# Patient Record
Sex: Female | Born: 1950 | Race: White | Hispanic: No | Marital: Married | Smoking: Never smoker
Health system: Southern US, Community
[De-identification: ages and names within clinical notes are randomized; demographics above are authoritative.]

## PROBLEM LIST (undated history)

## (undated) DIAGNOSIS — I493 Ventricular premature depolarization: Secondary | ICD-10-CM

## (undated) DIAGNOSIS — C181 Malignant neoplasm of appendix: Secondary | ICD-10-CM

## (undated) HISTORY — PX: TONSILLECTOMY: SUR1361

---

## 1990-10-27 HISTORY — PX: APPENDECTOMY: SHX54

## 1990-10-27 HISTORY — PX: COLON SURGERY: SHX602

## 2014-08-03 DIAGNOSIS — R079 Chest pain, unspecified: Secondary | ICD-10-CM | POA: Diagnosis present

## 2014-08-03 DIAGNOSIS — R002 Palpitations: Secondary | ICD-10-CM | POA: Insufficient documentation

## 2014-08-03 DIAGNOSIS — H5200 Hypermetropia, unspecified eye: Secondary | ICD-10-CM | POA: Insufficient documentation

## 2014-08-03 DIAGNOSIS — N390 Urinary tract infection, site not specified: Secondary | ICD-10-CM | POA: Insufficient documentation

## 2014-08-03 DIAGNOSIS — R319 Hematuria, unspecified: Secondary | ICD-10-CM | POA: Insufficient documentation

## 2014-08-03 DIAGNOSIS — H524 Presbyopia: Secondary | ICD-10-CM | POA: Insufficient documentation

## 2014-08-03 DIAGNOSIS — R102 Pelvic and perineal pain: Secondary | ICD-10-CM | POA: Insufficient documentation

## 2014-08-03 DIAGNOSIS — R1032 Left lower quadrant pain: Secondary | ICD-10-CM | POA: Insufficient documentation

## 2014-08-03 DIAGNOSIS — M8580 Other specified disorders of bone density and structure, unspecified site: Secondary | ICD-10-CM | POA: Insufficient documentation

## 2014-08-03 DIAGNOSIS — R300 Dysuria: Secondary | ICD-10-CM | POA: Insufficient documentation

## 2015-10-07 DIAGNOSIS — M810 Age-related osteoporosis without current pathological fracture: Secondary | ICD-10-CM | POA: Insufficient documentation

## 2015-10-07 DIAGNOSIS — R109 Unspecified abdominal pain: Secondary | ICD-10-CM | POA: Insufficient documentation

## 2015-10-07 DIAGNOSIS — S92502A Displaced unspecified fracture of left lesser toe(s), initial encounter for closed fracture: Secondary | ICD-10-CM | POA: Insufficient documentation

## 2015-10-07 DIAGNOSIS — Z124 Encounter for screening for malignant neoplasm of cervix: Secondary | ICD-10-CM | POA: Insufficient documentation

## 2015-10-07 DIAGNOSIS — H53139 Sudden visual loss, unspecified eye: Secondary | ICD-10-CM | POA: Insufficient documentation

## 2015-10-07 DIAGNOSIS — Z9049 Acquired absence of other specified parts of digestive tract: Secondary | ICD-10-CM | POA: Insufficient documentation

## 2015-10-12 DIAGNOSIS — M810 Age-related osteoporosis without current pathological fracture: Secondary | ICD-10-CM | POA: Insufficient documentation

## 2015-10-12 DIAGNOSIS — R21 Rash and other nonspecific skin eruption: Secondary | ICD-10-CM | POA: Insufficient documentation

## 2015-11-28 ENCOUNTER — Encounter (HOSPITAL_BASED_OUTPATIENT_CLINIC_OR_DEPARTMENT_OTHER): Payer: Self-pay

## 2015-11-28 ENCOUNTER — Inpatient Hospital Stay (HOSPITAL_BASED_OUTPATIENT_CLINIC_OR_DEPARTMENT_OTHER)
Admission: EM | Admit: 2015-11-28 | Discharge: 2015-12-01 | DRG: 287 | Disposition: A | Discharge status: Home / Self Care | Payer: 59 | Attending: Cardiology | Admitting: Cardiology

## 2015-11-28 ENCOUNTER — Emergency Department (HOSPITAL_BASED_OUTPATIENT_CLINIC_OR_DEPARTMENT_OTHER): Payer: 59

## 2015-11-28 DIAGNOSIS — Q676 Pectus excavatum: Secondary | ICD-10-CM | POA: Diagnosis not present

## 2015-11-28 DIAGNOSIS — I471 Supraventricular tachycardia: Secondary | ICD-10-CM | POA: Diagnosis present

## 2015-11-28 DIAGNOSIS — R072 Precordial pain: Secondary | ICD-10-CM | POA: Diagnosis not present

## 2015-11-28 DIAGNOSIS — I493 Ventricular premature depolarization: Secondary | ICD-10-CM | POA: Diagnosis present

## 2015-11-28 DIAGNOSIS — Z85038 Personal history of other malignant neoplasm of large intestine: Secondary | ICD-10-CM | POA: Diagnosis not present

## 2015-11-28 DIAGNOSIS — I351 Nonrheumatic aortic (valve) insufficiency: Secondary | ICD-10-CM | POA: Diagnosis present

## 2015-11-28 DIAGNOSIS — I200 Unstable angina: Secondary | ICD-10-CM | POA: Diagnosis present

## 2015-11-28 DIAGNOSIS — I472 Ventricular tachycardia: Secondary | ICD-10-CM | POA: Diagnosis present

## 2015-11-28 DIAGNOSIS — I2510 Atherosclerotic heart disease of native coronary artery without angina pectoris: Secondary | ICD-10-CM | POA: Diagnosis not present

## 2015-11-28 DIAGNOSIS — Z8249 Family history of ischemic heart disease and other diseases of the circulatory system: Secondary | ICD-10-CM

## 2015-11-28 DIAGNOSIS — E876 Hypokalemia: Secondary | ICD-10-CM | POA: Diagnosis present

## 2015-11-28 DIAGNOSIS — I340 Nonrheumatic mitral (valve) insufficiency: Secondary | ICD-10-CM | POA: Diagnosis present

## 2015-11-28 DIAGNOSIS — R0789 Other chest pain: Secondary | ICD-10-CM | POA: Diagnosis not present

## 2015-11-28 DIAGNOSIS — R079 Chest pain, unspecified: Secondary | ICD-10-CM | POA: Diagnosis present

## 2015-11-28 DIAGNOSIS — I4729 Other ventricular tachycardia: Secondary | ICD-10-CM

## 2015-11-28 HISTORY — DX: Malignant neoplasm of appendix: C18.1

## 2015-11-28 LAB — COMPREHENSIVE METABOLIC PANEL
ALK PHOS: 69 U/L (ref 38–126)
ALT: 22 U/L (ref 14–54)
AST: 28 U/L (ref 15–41)
Albumin: 4.7 g/dL (ref 3.5–5.0)
Anion gap: 8 (ref 5–15)
BILIRUBIN TOTAL: 1.8 mg/dL — AB (ref 0.3–1.2)
BUN: 13 mg/dL (ref 6–20)
CALCIUM: 9.4 mg/dL (ref 8.9–10.3)
CO2: 25 mmol/L (ref 22–32)
CREATININE: 0.63 mg/dL (ref 0.44–1.00)
Chloride: 105 mmol/L (ref 101–111)
GFR calc Af Amer: >60 mL/min (ref >60)
Glucose, Bld: 87 mg/dL (ref 65–99)
Potassium: 3.4 mmol/L — ABNORMAL LOW (ref 3.5–5.1)
Sodium: 138 mmol/L (ref 135–145)
TOTAL PROTEIN: 7.9 g/dL (ref 6.5–8.1)

## 2015-11-28 LAB — CBC
HCT: 40.8 % (ref 36.0–46.0)
HEMATOCRIT: 34.4 % — AB (ref 36.0–46.0)
HEMOGLOBIN: 13.3 g/dL (ref 12.0–15.0)
HEMOGLOBIN: 11.9 g/dL — AB (ref 12.0–15.0)
MCH: 29.7 pg (ref 26.0–34.0)
MCH: 31.2 pg (ref 26.0–34.0)
MCHC: 32.6 g/dL (ref 30.0–36.0)
MCHC: 34.6 g/dL (ref 30.0–36.0)
MCV: 91.1 fL (ref 78.0–100.0)
MCV: 90.1 fL (ref 78.0–100.0)
PLATELETS: 191 10*3/uL (ref 150–400)
Platelets: 160 10*3/uL (ref 150–400)
RBC: 4.48 MIL/uL (ref 3.87–5.11)
RBC: 3.82 MIL/uL — AB (ref 3.87–5.11)
RDW: 12.7 % (ref 11.5–15.5)
RDW: 13 % (ref 11.5–15.5)
WBC: 6.6 10*3/uL (ref 4.0–10.5)
WBC: 5.3 10*3/uL (ref 4.0–10.5)

## 2015-11-28 LAB — CREATININE, SERUM: Creatinine, Ser: 0.69 mg/dL (ref 0.44–1.00)

## 2015-11-28 LAB — MAGNESIUM: Magnesium: 2 mg/dL (ref 1.7–2.4)

## 2015-11-28 LAB — TROPONIN I: Troponin I: <0.03 ng/mL (ref <0.031)

## 2015-11-28 LAB — BRAIN NATRIURETIC PEPTIDE: B NATRIURETIC PEPTIDE 5: 63.9 pg/mL (ref 0.0–100.0)

## 2015-11-28 LAB — TSH: TSH: 4.759 u[IU]/mL — AB (ref 0.350–4.500)

## 2015-11-28 MED ORDER — ASPIRIN 81 MG PO CHEW
324.0000 mg | CHEWABLE_TABLET | Freq: Once | ORAL | Status: AC
  Administered 2015-11-28: 324 mg via ORAL
  Filled 2015-11-28: qty 4

## 2015-11-28 MED ORDER — ACETAMINOPHEN 325 MG PO TABS: 650.0000 mg | ORAL_TABLET | ORAL | Status: DC | PRN

## 2015-11-28 MED ORDER — METOPROLOL TARTRATE 12.5 MG HALF TABLET
12.5000 mg | ORAL_TABLET | Freq: Two times a day (BID) | ORAL | Status: DC
  Administered 2015-11-28 – 2015-12-01 (×4): 12.5 mg via ORAL
  Filled 2015-11-28 (×4): qty 1

## 2015-11-28 MED ORDER — HEPARIN SODIUM (PORCINE) 5000 UNIT/ML IJ SOLN
5000.0000 [IU] | Freq: Three times a day (TID) | INTRAMUSCULAR | Status: DC
  Administered 2015-11-28 – 2015-11-30 (×5): 5000 [IU] via SUBCUTANEOUS
  Filled 2015-11-28 (×4): qty 1

## 2015-11-28 MED ORDER — ASPIRIN 81 MG PO CHEW: 324.0000 mg | CHEWABLE_TABLET | ORAL | Status: AC

## 2015-11-28 MED ORDER — POTASSIUM CHLORIDE CRYS ER 20 MEQ PO TBCR
40.0000 meq | EXTENDED_RELEASE_TABLET | Freq: Once | ORAL | Status: AC
  Administered 2015-11-28: 40 meq via ORAL
  Filled 2015-11-28: qty 2

## 2015-11-28 MED ORDER — NITROGLYCERIN 0.4 MG SL SUBL: 0.4000 mg | SUBLINGUAL_TABLET | SUBLINGUAL | Status: DC | PRN

## 2015-11-28 MED ORDER — ONDANSETRON HCL 4 MG/2ML IJ SOLN: 4.0000 mg | Freq: Four times a day (QID) | INTRAMUSCULAR | Status: DC | PRN

## 2015-11-28 MED ORDER — ASPIRIN 300 MG RE SUPP: 300.0000 mg | RECTAL | Status: AC

## 2015-11-28 MED ORDER — SODIUM CHLORIDE 0.9 % IV SOLN: 250.0000 mL | INTRAVENOUS | Status: DC | PRN

## 2015-11-28 MED ORDER — NITROGLYCERIN 0.4 MG SL SUBL
0.4000 mg | SUBLINGUAL_TABLET | SUBLINGUAL | Status: DC | PRN
  Administered 2015-11-28: 0.4 mg via SUBLINGUAL

## 2015-11-28 MED ORDER — SODIUM CHLORIDE 0.9% FLUSH
3.0000 mL | Freq: Two times a day (BID) | INTRAVENOUS | Status: DC
  Administered 2015-11-28 – 2015-12-01 (×5): 3 mL via INTRAVENOUS

## 2015-11-28 MED ORDER — NITROGLYCERIN 0.4 MG SL SUBL
SUBLINGUAL_TABLET | SUBLINGUAL | Status: AC
  Filled 2015-11-28: qty 3

## 2015-11-28 MED ORDER — SODIUM CHLORIDE 0.9% FLUSH: 3.0000 mL | INTRAVENOUS | Status: DC | PRN

## 2015-11-28 MED ORDER — ATORVASTATIN CALCIUM 80 MG PO TABS
80.0000 mg | ORAL_TABLET | Freq: Every day | ORAL | Status: DC
  Filled 2015-11-28 (×2): qty 1

## 2015-11-28 MED ORDER — ASPIRIN EC 81 MG PO TBEC
81.0000 mg | DELAYED_RELEASE_TABLET | Freq: Every day | ORAL | Status: DC
  Administered 2015-11-29 – 2015-12-01 (×3): 81 mg via ORAL
  Filled 2015-11-28 (×3): qty 1

## 2015-11-28 MED ORDER — ACTIVE PARTNERSHIP FOR HEALTH OF YOUR HEART BOOK
Freq: Once | Status: AC
  Administered 2015-11-28: 22:00:00
  Filled 2015-11-28: qty 1

## 2015-11-28 NOTE — H&P (Signed)
Physician History and Physical    Donna Dickson MRN: [id number] DOB/AGE: November 01, 1950 65 y.o. Admit date: 11/28/2015  Primary Care Physician: Dr [place] Cellar in [facility]  Primary Cardiologist: New  HPI: 65 yo with no significant past history presents for evaluation of chest pain and NSVT.  Patient has been generally healthy and takes no medications.  About 2 weeks ago, she began to notice episodes of dyspnea that would last for a few minutes.  They were not clearly related to exertion and sometimes occurred at rest.  These would happen daily.  She did not note palpitations.  Starting about 2 days ago, she developed mild substernal tightness.  This has been constant now for about 2 days.  It is still present.  Today, she developed tingling in her left forearm so went to the ER.  No notes are available yet from the ER, but apparently she had a run of 11 beats of NSVT.  She has had frequent PVCs.  K is low at 3.4.  Troponin initially was normal.  ECG showed nonspecific ST depression inferiorly and anterolaterally, and ?prior lateral MI.   Currently, has very "minor" chest tightness.    She has never smoked.  Has coronary disease in her family.   Review of systems complete and found to be negative unless listed above   PMH: - Pectus excavatum  FH:  - Grandfather with MI in his 33s.  - Brother #1 with MI in his 31s. - Brother #2 with MI in his 78s  Current Facility-Administered Medications  Medication Dose Route Frequency Provider Last Rate Last Dose  . 0.9 %  sodium chloride infusion  250 mL Intravenous PRN Larey Dresser, MD      . acetaminophen (TYLENOL) tablet 650 mg  650 mg Oral Q4H PRN Larey Dresser, MD      . active partnership for health of your heart book   Does not apply Once Burnell Blanks, MD      . aspirin chewable tablet 324 mg  324 mg Oral NOW Larey Dresser, MD       Or  . aspirin suppository 300 mg  300 mg Rectal NOW Larey Dresser, MD      . Derrill Memo ON  11/29/2015] aspirin EC tablet 81 mg  81 mg Oral Daily Larey Dresser, MD      . Derrill Memo ON 11/29/2015] atorvastatin (LIPITOR) tablet 80 mg  80 mg Oral q1800 Larey Dresser, MD      . heparin injection 5,000 Units  5,000 Units Subcutaneous 3 times per day Larey Dresser, MD      . metoprolol tartrate (LOPRESSOR) tablet 12.5 mg  12.5 mg Oral BID Larey Dresser, MD      . nitroGLYCERIN (NITROSTAT) SL tablet 0.4 mg  0.4 mg Sublingual Q5 min PRN Tanna Furry, MD   0.4 mg at 11/28/15 1748  . nitroGLYCERIN (NITROSTAT) SL tablet 0.4 mg  0.4 mg Sublingual Q5 Min x 3 PRN Larey Dresser, MD      . ondansetron [facility]) injection 4 mg  4 mg Intravenous Q6H PRN Larey Dresser, MD      . potassium chloride SA (K-DUR,KLOR-CON) CR tablet 40 mEq  40 mEq Oral Once Larey Dresser, MD      . sodium chloride flush (NS) 0.9 % injection 3 mL  3 mL Intravenous Q12H Larey Dresser, MD      . sodium chloride flush (NS) 0.9 %  injection 3 mL  3 mL Intravenous PRN Larey Dresser, MD         Social History   Social History  . Marital Status: Married    Spouse Name: N/A  . Number of Children: N/A  . Years of Education: N/A   Occupational History  . Not on file.   Social History Main Topics  . Smoking status: Never Smoker   . Smokeless tobacco: Not on file  . Alcohol Use: Yes     Comment: rare  . Drug Use: No  . Sexual Activity: Not on file   Other Topics Concern  . Not on file   Social History Narrative  . No narrative on file    Physical Exam: Blood pressure 96/61, pulse 68, temperature 97.9 F (36.6 C), temperature source Oral, resp. rate 18, height 5\' 3"  (1.6 m), weight 116 lb 8 oz (52.844 kg), SpO2 100 %.  General: NAD Neck: No JVD, no thyromegaly or thyroid nodule.  Lungs: Clear to auscultation bilaterally with normal respiratory effort. CV: Nondisplaced PMI.  Heart regular S1/S2, no S3/S4, no murmur.  No peripheral edema.  No carotid bruit.  Normal pedal pulses.  Abdomen: Soft, nontender, no  hepatosplenomegaly, no distention.  Skin: Intact without lesions or rashes.  Neurologic: Alert and oriented x 3.  Psych: Normal affect. Extremities: No clubbing or cyanosis.  HEENT: Normal.   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  ASSESSMENT AND PLAN:  65 yo with no significant past history presents for evaluation of chest pain and NSVT. 1. Chest pain: Atypical, prolonged and mild chest pain with negative cardiac enzymes so far.  Prior to chest pain, she had episodes of dyspnea not clearly related to exertion.  Atypical symptoms for coronary ischemia.  However, her ECG is abnormal, and per report, she had an episode of [facility]VT at [facility].  - Cycle troponin, heparin if troponin positive.  - ASA 81 - Add statin and metoprolol 12.5 mg bid for now.  - If she rules out, would plan for ETT-Cardiolite tomorrow.  2. Ventricular arrhythmias: PVCs ongoing currently. Reported 11 beat run of NSVT.  Must be concerned for ventricular arrhythmias related to ischemia.  Also has mildly low K.  - Replace K, check Mg - metoprolol 12.5 mg bid for now. - Echo  Signed: Loralie Champagne 11/28/2015, 9:46 PM

## 2015-11-28 NOTE — Plan of Care (Signed)
Problem: Consults Goal: Chest Pain Patient Education (See Patient Education module for education specifics.) Outcome: Progressing Showed the client the Angina Pectoris video and answered her questions waiting for cardiology to come admit her

## 2015-11-28 NOTE — ED Notes (Addendum)
SOB, chest tightness x 1 weeks-tingling to left arm x today-NAD-steady gait

## 2015-11-28 NOTE — ED Notes (Signed)
Patient transported to X-ray 

## 2015-11-28 NOTE — ED Provider Notes (Signed)
CSN: [id number]     Arrival date & time 11/28/15  1631 History   First MD Initiated Contact with Patient 11/28/15 1656     Chief Complaint  Patient presents with  . Shortness of Breath     HPI  Patient presents for evaluation of difficulty breathing and chest pain. She describes anywhere from 1-2 weeks of exertional dyspnea. She states she had a visitor this week and was a little bit her around the house. States several times she had to sit because she was short of breath. Denies any pain or tightness. During the day today and perhaps somewhat yesterday describes a tightness or "soreness" in her anterior chest. Today had a similar feeling in her forearm and presents here. No personal history of heart disease. No history of hypertension diabetes or hypercholesterolemia. Lifetime nonsmoker. Her brother had an MI. Had grandparents with heart disease.  Past Medical History  Diagnosis Date  . 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 Colon surgery     No family history on file. Social History  Substance Use Topics  . Smoking status: Never Smoker   . Smokeless tobacco: None  . Alcohol Use: Yes     Comment: rare   OB History    No data available     Review of Systems  Constitutional: Negative for fever, chills, diaphoresis, appetite change and fatigue.  HENT: Negative for mouth sores, sore throat and trouble swallowing.   Eyes: Negative for visual disturbance.  Respiratory: Negative for cough, chest tightness, shortness of breath and wheezing.   Cardiovascular: Negative for chest pain.  Gastrointestinal: Negative for nausea, vomiting, abdominal pain, diarrhea and abdominal distention.  Endocrine: Negative for polydipsia, polyphagia and polyuria.  Genitourinary: Negative for dysuria, frequency and hematuria.  Musculoskeletal: Negative for gait problem.  Skin: Negative for color change, pallor and rash.  Neurological: Negative for dizziness,  syncope, light-headedness and headaches.  Hematological: Does not bruise/bleed easily.  Psychiatric/Behavioral: Negative for behavioral problems and confusion.      Allergies  Review of patient's allergies indicates no known allergies.  Home Medications   Prior to Admission medications   Not on File   BP 96/61 mmHg  Pulse 68  Temp(Src) 97.9 F (36.6 C) (Oral)  Resp 18  Ht 5\' 3"  (1.6 m)  Wt 116 lb 8 oz (52.844 kg)  BMI 20.64 kg/m2  SpO2 100% Physical Exam  Constitutional: She is oriented to person, place, and time. She appears well-developed and well-nourished. No distress.  HENT:  Head: Normocephalic.  Eyes: Conjunctivae are normal. Pupils are equal, round, and reactive to light. No scleral icterus.  Neck: Normal range of motion. Neck supple. No thyromegaly present.  Cardiovascular: Normal rate.  An irregular rhythm present. Exam reveals no gallop and no friction rub.   No murmur heard. Bigeminy, and trigeminy on the monitor, and resting EKG.  Pulmonary/Chest: Effort normal and breath sounds normal. No respiratory distress. She has no wheezes. She has no rales.  Abdominal: Soft. Bowel sounds are normal. She exhibits no distension. There is no tenderness. There is no rebound.  Musculoskeletal: Normal range of motion.  Neurological: She is alert and oriented to person, place, and time.  Skin: Skin is warm and dry. No rash noted.  Psychiatric: She has a normal mood and affect. Her behavior is normal.    ED Course  Procedures (including critical care time) Labs Review Labs Reviewed  COMPREHENSIVE METABOLIC PANEL - Abnormal; Notable  for the following:    Potassium 3.4 (*)    Total Bilirubin 1.8 (*)    All other components within normal limits  CBC - Abnormal; Notable for the following:    RBC 3.82 (*)    Hemoglobin 11.9 (*)    HCT 34.4 (*)    All other components within normal limits  TSH - Abnormal; Notable for the following:    TSH 4.759 (*)    All other  components within normal limits  TROPONIN I  CBC  CREATININE, SERUM  TROPONIN I  BRAIN NATRIURETIC PEPTIDE  MAGNESIUM  BASIC METABOLIC PANEL  CBC  TROPONIN I  TROPONIN I  LIPID PANEL    Imaging Review Dg Chest 2 View  11/28/2015  CLINICAL DATA:  Chest pain EXAM: CHEST  2 VIEW COMPARISON:  None. FINDINGS: Normal heart size. Normal mediastinal contour. No pneumothorax. No pleural effusion. Lungs appear clear, with no acute consolidative airspace disease and no pulmonary edema. Pectus excavatum deformity. IMPRESSION: No active cardiopulmonary disease. Pectus excavatum deformity. Electronically Signed   By: Ilona Sorrel M.D.   On: 11/28/2015 17:16   I have personally reviewed and evaluated these images and lab results as part of my medical decision-making.   EKG Interpretation   Date/Time:  Wednesday November 28 2015 18:10:03 EST Ventricular Rate:  81 PR Interval:  143 QRS Duration: 75 QT Interval:  400 QTC Calculation: 464 R Axis:   104 Text Interpretation:  Sinus rhythm Paired ventricular premature complexes  Right atrial enlargement Lateral infarct, old Confirmed by Jeneen Rinks  MD, [place]  [phone]) on 11/28/2015 11:45:25 PM Also confirmed by Jeneen Rinks  MD, [place] ([id number])   on 11/28/2015 11:46:04 PM      MDM   Final diagnoses:  Chest pain, unspecified chest pain type    Patient reports intermittent episodes of dyspnea over the last several weeks. More equally this week. Describes a "tightness or soreness" in her chest that she states is very very mild. Today she had some discomfort in her lower forearm described as a tightness as well. She is uncertain if this was associated with the discomfort in her chest.  In general she has increasing dyspnea, particularly with exertion. No chest tightness. All in the emergency room she had a recorded strip 2 of an SVT. One of 90 beats, one of 10 beats. Similar morphology to her PVCs on her resting EKG. First enzyme is normal. Given a single  nitroglycerin and states that her minimal chest discomfort is gone. I discussed the case with Dr. Levi Aland. Except the patient transferred to [facility] for rule out, telemetry, and testing regarding myocardial ischemia.    Tanna Furry, MD 11/28/15 [phone]

## 2015-11-29 ENCOUNTER — Other Ambulatory Visit (HOSPITAL_COMMUNITY): Payer: 59

## 2015-11-29 ENCOUNTER — Encounter (HOSPITAL_COMMUNITY): Payer: Self-pay | Admitting: General Practice

## 2015-11-29 ENCOUNTER — Inpatient Hospital Stay (HOSPITAL_COMMUNITY): Payer: 59

## 2015-11-29 DIAGNOSIS — I4729 Other ventricular tachycardia: Secondary | ICD-10-CM

## 2015-11-29 DIAGNOSIS — R079 Chest pain, unspecified: Secondary | ICD-10-CM

## 2015-11-29 DIAGNOSIS — R072 Precordial pain: Secondary | ICD-10-CM

## 2015-11-29 DIAGNOSIS — R0789 Other chest pain: Secondary | ICD-10-CM

## 2015-11-29 DIAGNOSIS — I472 Ventricular tachycardia: Secondary | ICD-10-CM

## 2015-11-29 DIAGNOSIS — I493 Ventricular premature depolarization: Secondary | ICD-10-CM

## 2015-11-29 LAB — NM MYOCAR MULTI W/SPECT W/WALL MOTION / EF
CHL CUP MPHR: 158 {beats}/min
CHL CUP NUCLEAR SDS: 4
CHL CUP NUCLEAR SRS: 5
CHL CUP NUCLEAR SSS: 9
CHL CUP STRESS STAGE 1 DBP: 55 mmHg
CHL CUP STRESS STAGE 1 GRADE: 0 %
CHL CUP STRESS STAGE 1 HR: 67 {beats}/min
CHL CUP STRESS STAGE 2 SPEED: 0 mph
CHL CUP STRESS STAGE 3 GRADE: 0 %
CHL CUP STRESS STAGE 3 SPEED: 1.3 mph
CHL CUP STRESS STAGE 4 GRADE: 0.1 %
CHL CUP STRESS STAGE 4 SPEED: 1.4 mph
CHL CUP STRESS STAGE 5 DBP: 76 mmHg
CHL CUP STRESS STAGE 6 SPEED: 2.5 mph
CHL CUP STRESS STAGE 7 GRADE: 14 %
CHL CUP STRESS STAGE 7 SPEED: 3.4 mph
CHL CUP STRESS STAGE 8 SBP: 128 mmHg
CHL CUP STRESS STAGE 8 SPEED: 0 mph
CHL CUP STRESS STAGE 9 HR: 83 {beats}/min
CSEPPBP: 109 mmHg
CSEPPHR: 133 {beats}/min
CSEPPMHR: 84 %
Estimated workload: 10.1 METS
Exercise duration (min): 8 min
Exercise duration (sec): 31 s
LV dias vol: 54 mL
LV sys vol: 14 mL
NUC STRESS TID: 1.21
Percent HR: 89 %
RATE: 0.31
RPE: 17
Rest HR: 65 {beats}/min
Stage 1 SBP: 94 mmHg
Stage 1 Speed: 0 mph
Stage 2 Grade: 0 %
Stage 2 HR: 80 {beats}/min
Stage 3 HR: 82 {beats}/min
Stage 4 HR: 80 {beats}/min
Stage 5 Grade: 10 %
Stage 5 HR: 115 {beats}/min
Stage 5 SBP: 116 mmHg
Stage 5 Speed: 1.7 mph
Stage 6 DBP: 62 mmHg
Stage 6 Grade: 12 %
Stage 6 HR: 129 {beats}/min
Stage 6 SBP: 81 mmHg
Stage 7 DBP: 66 mmHg
Stage 7 HR: 133 {beats}/min
Stage 7 SBP: 109 mmHg
Stage 8 DBP: 53 mmHg
Stage 8 Grade: 0 %
Stage 8 HR: 120 {beats}/min
Stage 9 Grade: 0 %
Stage 9 Speed: 0 mph

## 2015-11-29 LAB — LIPID PANEL
Cholesterol: 136 mg/dL (ref 0–200)
HDL: 73 mg/dL (ref >40)
LDL CALC: 53 mg/dL (ref 0–99)
TRIGLYCERIDES: 49 mg/dL (ref <150)
Total CHOL/HDL Ratio: 1.9 RATIO
VLDL: 10 mg/dL (ref 0–40)

## 2015-11-29 LAB — BASIC METABOLIC PANEL
Anion gap: 5 (ref 5–15)
BUN: 10 mg/dL (ref 6–20)
CALCIUM: 9 mg/dL (ref 8.9–10.3)
CO2: 28 mmol/L (ref 22–32)
CREATININE: 0.74 mg/dL (ref 0.44–1.00)
Chloride: 112 mmol/L — ABNORMAL HIGH (ref 101–111)
GFR calc Af Amer: >60 mL/min (ref >60)
GLUCOSE: 96 mg/dL (ref 65–99)
POTASSIUM: 4.1 mmol/L (ref 3.5–5.1)
SODIUM: 145 mmol/L (ref 135–145)

## 2015-11-29 LAB — CBC
HEMATOCRIT: 35.7 % — AB (ref 36.0–46.0)
Hemoglobin: 11.9 g/dL — ABNORMAL LOW (ref 12.0–15.0)
MCH: 30.4 pg (ref 26.0–34.0)
MCHC: 33.3 g/dL (ref 30.0–36.0)
MCV: 91.1 fL (ref 78.0–100.0)
PLATELETS: 168 10*3/uL (ref 150–400)
RBC: 3.92 MIL/uL (ref 3.87–5.11)
RDW: 13.1 % (ref 11.5–15.5)
WBC: 4.1 10*3/uL (ref 4.0–10.5)

## 2015-11-29 LAB — TROPONIN I: Troponin I: <0.03 ng/mL (ref <0.031)

## 2015-11-29 MED ORDER — TECHNETIUM TC 99M SESTAMIBI GENERIC - CARDIOLITE
10.0000 | Freq: Once | INTRAVENOUS | Status: AC | PRN
  Administered 2015-11-29: 10 via INTRAVENOUS

## 2015-11-29 MED ORDER — TECHNETIUM TC 99M SESTAMIBI GENERIC - CARDIOLITE
30.0000 | Freq: Once | INTRAVENOUS | Status: AC | PRN
  Administered 2015-11-29: 30 via INTRAVENOUS

## 2015-11-29 MED ORDER — SODIUM CHLORIDE 0.9 % IV SOLN
INTRAVENOUS | Status: DC
  Administered 2015-11-29: 18:00:00 via INTRAVENOUS

## 2015-11-29 NOTE — Progress Notes (Signed)
Myoview Result as below:   Blood pressure demonstrated a hypotensive response to exercise.  Downsloping ST segment depression ST segment depression of 1 mm was noted during stress in the aVF, III, II, V5 and V6 leads.  Defect 1: There is a small defect of mild severity present in the basal inferoseptal location.  This is a low risk study.  Low risk stress nuclear study with a mild basal septal perfusion defect, that is mostly fixed. This may represent an "LBBB" type artifact related to incessant monomorphic PVCs/VT with LBBB morphology.  Otherwise, there is normal perfusion and normal left ventricular regional and global systolic function.   Dr. Sallyanne Kuster recommended EP evaluation. Unable to titrate BB due to hypotension. Will give IV fluid overnight and rounding team will reasess in morning. Updated the patient with plan.   Hetty Linhart, [place]

## 2015-11-29 NOTE — Progress Notes (Signed)
Received order however pt for Myoview today. Will await results. Yves Dill CES, ACSM 9:12 AM 11/29/2015

## 2015-11-29 NOTE — Progress Notes (Signed)
Patient presented for Exercise Myoview.  Tolerated procedure well.   The patient has one episode of PVC prior to test. During stage 1 she has lots of PVCs and multiple small runs of NSVT. Stage 2 and 3 -->no episode of PVC or NSVT. During recovery she has frequent PVCS, NSVT (one run was more than 20 beats) and ventricular trigeminy.   More prominent ST depression/flateeing during stage III. Intermittent felt palpitations during test. No chest pain. Stable SOB. Final result to follow. ?EP consult vs monitor set up at discharge.    Donna Dickson, [place]

## 2015-11-29 NOTE — Progress Notes (Signed)
Subjective:  No chest pain, frequent ectopy and NS tachycardia but no awareness of it.   Objective:  Vital Signs in the last 24 hours: Temp:  [97.9 F (36.6 C)-98.4 F (36.9 C)] 98.2 F (36.8 C) (02/02 0749) Pulse Rate:  [40-82] 56 (02/02 0749) Resp:  [15-24] 20 (02/02 0749) BP: (82-126)/(45-89) 88/48 mmHg (02/02 0749) SpO2:  [99 %-100 %] 100 % (02/02 0749) Weight:  [115 lb (52.164 kg)-116 lb 8 oz (52.844 kg)] 115 lb 11.2 oz (52.481 kg) (02/02 [id number])  Intake/Output from previous day:  Intake/Output Summary (Last 24 hours) at 11/29/15 [id number] Last data filed at 11/28/15 2247  Gross per 24 hour  Intake      3 ml  Output      0 ml  Net      3 ml    Physical Exam: General appearance: alert, cooperative and no distress Neck: no carotid bruit and no JVD Lungs: clear to auscultation bilaterally Heart: regular rate and rhythm Extremities: no edema Neurologic: Grossly normal   Rate: 70  Rhythm: normal sinus rhythm and PVCs, PSVT runs, NSWCT runs  Lab Results:  Recent Labs  11/28/15 2138 11/29/15 0249  WBC 5.3 4.1  HGB 11.9* 11.9*  PLT 160 168    Recent Labs  11/28/15 1710 11/28/15 2138 11/29/15 0249  NA 138  --  145  K 3.4*  --  4.1  CL 105  --  112*  CO2 25  --  28  GLUCOSE 87  --  96  BUN 13  --  10  CREATININE 0.63 0.69 0.74    Recent Labs  11/28/15 2138 11/29/15 0249  TROPONINI <0.03 <0.03   No results for input(s): INR in the last 72 hours.  Scheduled Meds: . aspirin  324 mg Oral NOW   Or  . aspirin  300 mg Rectal NOW  . aspirin EC  81 mg Oral Daily  . atorvastatin  80 mg Oral q1800  . heparin  5,000 Units Subcutaneous 3 times per day  . metoprolol tartrate  12.5 mg Oral BID  . sodium chloride flush  3 mL Intravenous Q12H   Continuous Infusions:  PRN Meds:.sodium chloride, acetaminophen, nitroGLYCERIN, nitroGLYCERIN, ondansetron (ZOFRAN) IV, sodium chloride flush   Imaging: Dg Chest 2 View  11/28/2015  CLINICAL DATA:  Chest pain EXAM:  CHEST  2 VIEW COMPARISON:  None. FINDINGS: Normal heart size. Normal mediastinal contour. No pneumothorax. No pleural effusion. Lungs appear clear, with no acute consolidative airspace disease and no pulmonary edema. Pectus excavatum deformity. IMPRESSION: No active cardiopulmonary disease. Pectus excavatum deformity. Electronically Signed   By: Ilona Sorrel M.D.   On: 11/28/2015 17:16    Assessment/Plan:  65 yo with no significant past history presents for evaluation of chest pain and NSVT. Patient has been generally healthy and takes no medications. About 2 weeks ago, she began to notice episodes of dyspnea that would last for a few minutes. They were not clearly related to exertion and sometimes occurred at rest. These would happen daily. She did not note palpitations. Starting about 2 days ago, she developed mild substernal tightness.Troponin have been negative. Telemetry shows PVCs, PSVT, and NSWCT.  Principal Problem:   Chest pain Active Problems:   NSVT (nonsustained ventricular tachycardia) (HCC)   PLAN: ? If NSWCT is PSVT with aberrancy. Echo and exercise Myoview today (beta blocker held for stress).  Kerin Ransom PA-C 11/29/2015, 8:12 AM [phone]  I have personally seen and examined this patient with  Kerin Ransom, [place]. I agree with the assessment and plan as outlined above. She is admitted with atypical chest pain, PVCs. Troponin negative. Nuclear stress this am is pending. She had PvCs and NSVT before and after exercise but not during exercise. Echo pending today. Will titrate beta blocker for PVCs, NSVT. Further plans to follow after stress test.   Tiffiney Sparrow 11/29/2015 11:50 AM

## 2015-11-29 NOTE — Progress Notes (Signed)
Patients blood pressure remains low. Spoke with on call PA, patient is asymptomatic will continue to monitor.

## 2015-11-29 NOTE — Progress Notes (Addendum)
The client had 3 beats of VT right before 2 am and continues to have PVCs. Also had a soft BP of 82/45 during the night (see flow sheet) with no dizziness or lightheaded or any other symptoms. She received a new medication lopressor last night. Her BP did run low before this new medication and was in the 80's once yesterday during the day. I will continue to monitor the client closely.    Addendum: BP this morning 86/54 HR 70's with PVCs. No other symptoms with the soft BP. Will continue to monitor closely and give the on coming nurse this information.

## 2015-11-29 NOTE — Progress Notes (Signed)
Notified by CCMD of a 3 beat, 4 beat, and 13 beat run of Vtach all occuring since 0730. Patient asymptomatic, resting in bed. Paged on call MD to make aware.

## 2015-11-29 NOTE — Progress Notes (Signed)
UR Completed [place] Hijazi Graves-Bigelow, RN,BSN [phone]  

## 2015-11-30 ENCOUNTER — Encounter (HOSPITAL_COMMUNITY): Payer: Self-pay | Admitting: Cardiology

## 2015-11-30 ENCOUNTER — Inpatient Hospital Stay (HOSPITAL_COMMUNITY): Payer: 59

## 2015-11-30 ENCOUNTER — Encounter (HOSPITAL_COMMUNITY)
Admission: EM | Disposition: A | Discharge status: Home / Self Care | Payer: Self-pay | Source: Home / Self Care | Attending: Cardiology

## 2015-11-30 DIAGNOSIS — I2510 Atherosclerotic heart disease of native coronary artery without angina pectoris: Secondary | ICD-10-CM

## 2015-11-30 DIAGNOSIS — R931 Abnormal findings on diagnostic imaging of heart and coronary circulation: Secondary | ICD-10-CM

## 2015-11-30 HISTORY — PX: CARDIAC CATHETERIZATION: SHX172

## 2015-11-30 LAB — CBC
HEMATOCRIT: 36.9 % (ref 36.0–46.0)
HEMOGLOBIN: 12.7 g/dL (ref 12.0–15.0)
MCH: 31.2 pg (ref 26.0–34.0)
MCHC: 34.4 g/dL (ref 30.0–36.0)
MCV: 90.7 fL (ref 78.0–100.0)
Platelets: 140 10*3/uL — ABNORMAL LOW (ref 150–400)
RBC: 4.07 MIL/uL (ref 3.87–5.11)
RDW: 12.8 % (ref 11.5–15.5)
WBC: 5.3 10*3/uL (ref 4.0–10.5)

## 2015-11-30 LAB — CREATININE, SERUM: CREATININE: 0.7 mg/dL (ref 0.44–1.00)

## 2015-11-30 LAB — PROTIME-INR
INR: 1.1 (ref 0.00–1.49)
PROTHROMBIN TIME: 14.4 s (ref 11.6–15.2)

## 2015-11-30 SURGERY — LEFT HEART CATH AND CORONARY ANGIOGRAPHY
Anesthesia: LOCAL

## 2015-11-30 MED ORDER — MIDAZOLAM HCL 2 MG/2ML IJ SOLN
INTRAMUSCULAR | Status: DC | PRN
  Administered 2015-11-30: 1 mg via INTRAVENOUS

## 2015-11-30 MED ORDER — IOHEXOL 350 MG/ML SOLN
INTRAVENOUS | Status: DC | PRN
  Administered 2015-11-30: 70 mL via INTRA_ARTERIAL

## 2015-11-30 MED ORDER — SODIUM CHLORIDE 0.9% FLUSH: 3.0000 mL | INTRAVENOUS | Status: DC | PRN

## 2015-11-30 MED ORDER — METOPROLOL TARTRATE 12.5 MG HALF TABLET: 12.5000 mg | ORAL_TABLET | Freq: Two times a day (BID) | ORAL | Status: DC

## 2015-11-30 MED ORDER — HEPARIN (PORCINE) IN NACL 2-0.9 UNIT/ML-% IJ SOLN
INTRAMUSCULAR | Status: DC | PRN
  Administered 2015-11-30: 11:00:00

## 2015-11-30 MED ORDER — SODIUM CHLORIDE 0.9 % IV SOLN: 250.0000 mL | INTRAVENOUS | Status: DC | PRN

## 2015-11-30 MED ORDER — MIDAZOLAM HCL 2 MG/2ML IJ SOLN
INTRAMUSCULAR | Status: AC
  Filled 2015-11-30: qty 2

## 2015-11-30 MED ORDER — ASPIRIN 81 MG PO CHEW: 81.0000 mg | CHEWABLE_TABLET | ORAL | Status: DC

## 2015-11-30 MED ORDER — SODIUM CHLORIDE 0.9 % WEIGHT BASED INFUSION: 3.0000 mL/kg/h | INTRAVENOUS | Status: AC

## 2015-11-30 MED ORDER — FENTANYL CITRATE (PF) 100 MCG/2ML IJ SOLN
INTRAMUSCULAR | Status: DC | PRN
  Administered 2015-11-30: 12.5 ug

## 2015-11-30 MED ORDER — LIDOCAINE HCL (PF) 1 % IJ SOLN
INTRAMUSCULAR | Status: AC
  Filled 2015-11-30: qty 30

## 2015-11-30 MED ORDER — HEPARIN SODIUM (PORCINE) 5000 UNIT/ML IJ SOLN
5000.0000 [IU] | Freq: Three times a day (TID) | INTRAMUSCULAR | Status: DC
  Administered 2015-11-30 (×2): 5000 [IU] via SUBCUTANEOUS
  Filled 2015-11-30: qty 1

## 2015-11-30 MED ORDER — LIDOCAINE HCL (PF) 1 % IJ SOLN
INTRAMUSCULAR | Status: DC | PRN
  Administered 2015-11-30: 20 mL

## 2015-11-30 MED ORDER — FENTANYL CITRATE (PF) 100 MCG/2ML IJ SOLN
INTRAMUSCULAR | Status: AC
  Filled 2015-11-30: qty 2

## 2015-11-30 MED ORDER — SODIUM CHLORIDE 0.9 % IV SOLN: INTRAVENOUS | Status: DC

## 2015-11-30 MED ORDER — SODIUM CHLORIDE 0.9% FLUSH: 3.0000 mL | Freq: Two times a day (BID) | INTRAVENOUS | Status: DC

## 2015-11-30 MED ORDER — HEPARIN (PORCINE) IN NACL 2-0.9 UNIT/ML-% IJ SOLN
INTRAMUSCULAR | Status: AC
  Filled 2015-11-30: qty 1000

## 2015-11-30 MED ORDER — SODIUM CHLORIDE 0.9% FLUSH
3.0000 mL | Freq: Two times a day (BID) | INTRAVENOUS | Status: DC
  Administered 2015-11-30 – 2015-12-01 (×3): 3 mL via INTRAVENOUS

## 2015-11-30 MED ORDER — FLECAINIDE ACETATE 50 MG PO TABS
50.0000 mg | ORAL_TABLET | Freq: Two times a day (BID) | ORAL | Status: DC
  Administered 2015-11-30 – 2015-12-01 (×2): 50 mg via ORAL
  Filled 2015-11-30 (×4): qty 1

## 2015-11-30 SURGICAL SUPPLY — 10 items
CATH INFINITI 5FR MULTPACK ANG (CATHETERS) ×2 IMPLANT
CATH SITESEER 5F NTR (CATHETERS) ×2 IMPLANT
ELECT DEFIB PAD ADLT CADENCE (PAD) ×2 IMPLANT
KIT HEART LEFT (KITS) ×2 IMPLANT
PACK CARDIAC CATHETERIZATION (CUSTOM PROCEDURE TRAY) ×2 IMPLANT
SHEATH PINNACLE 5F 10CM (SHEATH) ×2 IMPLANT
SYR MEDRAD MARK V 150ML (SYRINGE) ×2 IMPLANT
TRANSDUCER W/STOPCOCK (MISCELLANEOUS) ×2 IMPLANT
TUBING CIL FLEX 10 FLL-RA (TUBING) ×2 IMPLANT
WIRE EMERALD 3MM-J .035X150CM (WIRE) ×2 IMPLANT

## 2015-11-30 NOTE — Progress Notes (Addendum)
Site area: Right groin a 5 french sheath was removed  Site Prior to Removal:  Level 0  Pressure Applied For 15 MINUTES    Minutes Beginning at 1155am  Manual:   Yes.    Patient Status During Pull:  stable  Post Pull Groin Site:  Level 0  Post Pull Instructions Given:  Yes.    Post Pull Pulses Present:  Yes.    Dressing Applied:  Yes.    Comments:  VS remain stable during sheath pull

## 2015-11-30 NOTE — Progress Notes (Signed)
Dr. Curt Bears spoken with regarding metoprolol parameter to give considering pt SBP had been  in the 70's to low 90's range. With verbal order to give metoprolol if SBP > 80.

## 2015-11-30 NOTE — Progress Notes (Signed)
Subjective:  No chest pain, no near syncope  Objective:  Vital Signs in the last 24 hours: Temp:  [97.8 F (36.6 C)-98 F (36.7 C)] 97.8 F (36.6 C) (02/03 0421) Pulse Rate:  [57-131] 71 (02/03 0421) Resp:  [16-18] 17 (02/03 0421) BP: (77-128)/(41-76) 84/51 mmHg (02/03 0421) SpO2:  [99 %-100 %] 99 % (02/03 0421) Weight:  [116 lb 11.2 oz (52.935 kg)] 116 lb 11.2 oz (52.935 kg) (02/03 0421)  Intake/Output from previous day:  Intake/Output Summary (Last 24 hours) at 11/30/15 0750 Last data filed at 11/30/15 0400  Gross per 24 hour  Intake    740 ml  Output    450 ml  Net    290 ml    Physical Exam: General appearance: alert, cooperative and no distress Lungs: clear to auscultation bilaterally Heart: regular rate and rhythm Neurologic: Grossly normal   Rate: 50-70  Rhythm: normal sinus rhythm, premature ventricular contractions (PVC) and NSWCT runs  Lab Results:  Recent Labs  11/28/15 2138 11/29/15 0249  WBC 5.3 4.1  HGB 11.9* 11.9*  PLT 160 168    Recent Labs  11/28/15 1710 11/28/15 2138 11/29/15 0249  NA 138  --  145  K 3.4*  --  4.1  CL 105  --  112*  CO2 25  --  28  GLUCOSE 87  --  96  BUN 13  --  10  CREATININE 0.63 0.69 0.74    Recent Labs  11/29/15 0249 11/29/15 1143  TROPONINI <0.03 <0.03   No results for input(s): INR in the last 72 hours.  Scheduled Meds: . aspirin EC  81 mg Oral Daily  . atorvastatin  80 mg Oral q1800  . heparin  5,000 Units Subcutaneous 3 times per day  . metoprolol tartrate  12.5 mg Oral BID  . sodium chloride flush  3 mL Intravenous Q12H   Continuous Infusions: . sodium chloride 75 mL/hr at 11/29/15 1808   PRN Meds:.sodium chloride, acetaminophen, nitroGLYCERIN, nitroGLYCERIN, ondansetron (ZOFRAN) IV, sodium chloride flush   Imaging: Dg Chest 2 View  11/28/2015  CLINICAL DATA:  Chest pain EXAM: CHEST  2 VIEW COMPARISON:  None. FINDINGS: Normal heart size. Normal mediastinal contour. No pneumothorax. No  pleural effusion. Lungs appear clear, with no acute consolidative airspace disease and no pulmonary edema. Pectus excavatum deformity. IMPRESSION: No active cardiopulmonary disease. Pectus excavatum deformity. Electronically Signed   By: Ilona Sorrel M.D.   On: 11/28/2015 17:16     Cardiac Studies: Nm Myocar Multi W/spect W/wall Motion / Ef  11/29/2015   Blood pressure demonstrated a hypotensive response to exercise.  Downsloping ST segment depression ST segment depression of 1 mm was noted during stress in the aVF, III, II, V5 and V6 leads.  Defect 1: There is a small defect of mild severity present in the basal inferoseptal location.  This is a low risk study.  Low risk stress nuclear study with a mild basal septal perfusion defect, that is mostly fixed. This may represent an "LBBB" type artifact related to incessant monomorphic PVCs/VT with LBBB morphology. Otherwise, there is normal perfusion and normal left ventricular regional and global systolic function.    Assessment/Plan:  65 yo with no significant past history presents for evaluation of chest pain and NSVT. Patient has been generally healthy and takes no medications. About 2 weeks ago, she began to notice episodes of dyspnea that would last for a few minutes. They were not clearly related to exertion and sometimes occurred  at rest. These would happen daily. She did not note palpitations. Starting about 2 days ago, she developed mild substernal tightness.Troponin have been negative. Telemetry shows PVCs, PSVT, and NSWCT. Myoview 11/29/15- normal LVF, no ischemia.    Principal Problem:   Chest pain Active Problems:   NSVT (nonsustained ventricular tachycardia) (HCC)   PLAN: Unable to titrate beta blocker secondary to B/P. Frequency and duration of NSWCT does appear to have improved on low dose Lopressor.  Per Dr Victorino December recomendation  I have spoken with EP- they will see today.  Kerin Ransom PA-C 11/30/2015, 7:50  AM [phone]  I have personally seen and examined this patient with Kerin Ransom, PA-C. I agree with the assessment and plan as outlined above.  1. Chest pain: Her chest pain is described as pressure in her chest when she feels her heart skipping beats. She has PVCs and NSVT. Nuclear stress test with small defect but no large areas of ischemia. She had PVCs before exercise which lessened with exercise but there was ST depression with exercise. She has no risk factors for CAD except age. She is on ASA and statin. We started a beta blocker but she has been hypotensive. LV function normal by stress test. Echo is pending. I think we need to exclude CAD before we proceed with EP evaluation for her PVCs and NSVT. I have reviewed cath today and she agrees to proceed. Will plan cath today. Risks and benefits reviewed.   2. PVCs/NSVT: She is on a beta blocker but tolerating poorly with ongoing hypotension. Will ask EP to see post cath.   Argyle Gustafson 11/30/2015 8:21 AM

## 2015-11-30 NOTE — Progress Notes (Signed)
  Echocardiogram 2D Echocardiogram has been performed.  Jennette Dubin 11/30/2015, 9:14 AM

## 2015-11-30 NOTE — Interval H&P Note (Signed)
History and Physical Interval Note:  11/30/2015 10:24 AM  [name]  has presented today for surgery, with the diagnosis of Non-sustained Ventricular Tachycardia & Chest Pain (Unstable Angina).  The various methods of treatment have been discussed with the patient and family. After consideration of risks, benefits and other options for treatment, the patient has consented to  Procedure(s): Left Heart Cath and Coronary Angiography (N/A) as a surgical intervention .  The patient's history has been reviewed, patient examined, no change in status, stable for surgery.  I have reviewed the patient's chart and labs.  Questions were answered to the patient's satisfaction.    Cath Lab Visit (complete for each Cath Lab visit)  Clinical Evaluation Leading to the Procedure:   ACS: Yes.   - NSVT  Non-ACS:    Anginal Classification: No Symptoms  Anti-ischemic medical therapy: Minimal Therapy (1 class of medications)  Non-Invasive Test Results: Equivocal test results - abnormal EKG, but normal images  Prior CABG: No previous CABG  CAD Assessment (Coronary Angiography With or Without Left Heart Catheterization and/or Left Ventriculography)  Patient Information: AUC FOR CARDIAC CATHETERIZATION   Arrhythmias   Etiology Unclear After Initial Evaluation   VF or sustained VT (?6 beats VT) with or without symptoms  AUC Score:   A (8)   Indication:   58   AUC of PCI pending diagnostic results  HARDING, DAVID W

## 2015-11-30 NOTE — Progress Notes (Addendum)
Spoke with Philbert Riser about low BP 77/41 and holding the lopressor ordered. The client continues to be asymptomatic with these low blood pressure readings denying any feelings of light headiness or dizziness. I will continue to monitor the client closely.   Addendum: The clients blood pressure was 84/51 this morning and she is still asymptomatic. I will continue to monitor her closely.

## 2015-11-30 NOTE — H&P (View-Only) (Signed)
Subjective:  No chest pain, no near syncope  Objective:  Vital Signs in the last 24 hours: Temp:  [97.8 F (36.6 C)-98 F (36.7 C)] 97.8 F (36.6 C) (02/03 0421) Pulse Rate:  [57-131] 71 (02/03 0421) Resp:  [16-18] 17 (02/03 0421) BP: (77-128)/(41-76) 84/51 mmHg (02/03 0421) SpO2:  [99 %-100 %] 99 % (02/03 0421) Weight:  [116 lb 11.2 oz (52.935 kg)] 116 lb 11.2 oz (52.935 kg) (02/03 0421)  Intake/Output from previous day:  Intake/Output Summary (Last 24 hours) at 11/30/15 0750 Last data filed at 11/30/15 0400  Gross per 24 hour  Intake    740 ml  Output    450 ml  Net    290 ml    Physical Exam: General appearance: alert, cooperative and no distress Lungs: clear to auscultation bilaterally Heart: regular rate and rhythm Neurologic: Grossly normal   Rate: 50-70  Rhythm: normal sinus rhythm, premature ventricular contractions (PVC) and NSWCT runs  Lab Results:  Recent Labs  11/28/15 2138 11/29/15 0249  WBC 5.3 4.1  HGB 11.9* 11.9*  PLT 160 168    Recent Labs  11/28/15 1710 11/28/15 2138 11/29/15 0249  NA 138  --  145  K 3.4*  --  4.1  CL 105  --  112*  CO2 25  --  28  GLUCOSE 87  --  96  BUN 13  --  10  CREATININE 0.63 0.69 0.74    Recent Labs  11/29/15 0249 11/29/15 1143  TROPONINI <0.03 <0.03   No results for input(s): INR in the last 72 hours.  Scheduled Meds: . aspirin EC  81 mg Oral Daily  . atorvastatin  80 mg Oral q1800  . heparin  5,000 Units Subcutaneous 3 times per day  . metoprolol tartrate  12.5 mg Oral BID  . sodium chloride flush  3 mL Intravenous Q12H   Continuous Infusions: . sodium chloride 75 mL/hr at 11/29/15 1808   PRN Meds:.sodium chloride, acetaminophen, nitroGLYCERIN, nitroGLYCERIN, ondansetron (ZOFRAN) IV, sodium chloride flush   Imaging: Dg Chest 2 View  11/28/2015  CLINICAL DATA:  Chest pain EXAM: CHEST  2 VIEW COMPARISON:  None. FINDINGS: Normal heart size. Normal mediastinal contour. No pneumothorax. No  pleural effusion. Lungs appear clear, with no acute consolidative airspace disease and no pulmonary edema. Pectus excavatum deformity. IMPRESSION: No active cardiopulmonary disease. Pectus excavatum deformity. Electronically Signed   By: Ilona Sorrel M.D.   On: 11/28/2015 17:16     Cardiac Studies: Nm Myocar Multi W/spect W/wall Motion / Ef  11/29/2015   Blood pressure demonstrated a hypotensive response to exercise.  Downsloping ST segment depression ST segment depression of 1 mm was noted during stress in the aVF, III, II, V5 and V6 leads.  Defect 1: There is a small defect of mild severity present in the basal inferoseptal location.  This is a low risk study.  Low risk stress nuclear study with a mild basal septal perfusion defect, that is mostly fixed. This may represent an "LBBB" type artifact related to incessant monomorphic PVCs/VT with LBBB morphology. Otherwise, there is normal perfusion and normal left ventricular regional and global systolic function.    Assessment/Plan:  65 yo with no significant past history presents for evaluation of chest pain and NSVT. Patient has been generally healthy and takes no medications. About 2 weeks ago, she began to notice episodes of dyspnea that would last for a few minutes. They were not clearly related to exertion and sometimes occurred  at rest. These would happen daily. She did not note palpitations. Starting about 2 days ago, she developed mild substernal tightness.Troponin have been negative. Telemetry shows PVCs, PSVT, and NSWCT. Myoview 11/29/15- normal LVF, no ischemia.    Principal Problem:   Chest pain Active Problems:   NSVT (nonsustained ventricular tachycardia) (HCC)   PLAN: Unable to titrate beta blocker secondary to B/P. Frequency and duration of NSWCT does appear to have improved on low dose Lopressor.  Per Dr Victorino December recomendation  I have spoken with EP- they will see today.  Kerin Ransom PA-C 11/30/2015, 7:50  AM [phone]  I have personally seen and examined this patient with Kerin Ransom, PA-C. I agree with the assessment and plan as outlined above.  1. Chest pain: Her chest pain is described as pressure in her chest when she feels her heart skipping beats. She has PVCs and NSVT. Nuclear stress test with small defect but no large areas of ischemia. She had PVCs before exercise which lessened with exercise but there was ST depression with exercise. She has no risk factors for CAD except age. She is on ASA and statin. We started a beta blocker but she has been hypotensive. LV function normal by stress test. Echo is pending. I think we need to exclude CAD before we proceed with EP evaluation for her PVCs and NSVT. I have reviewed cath today and she agrees to proceed. Will plan cath today. Risks and benefits reviewed.   2. PVCs/NSVT: She is on a beta blocker but tolerating poorly with ongoing hypotension. Will ask EP to see post cath.   MCALHANY,CHRISTOPHER 11/30/2015 8:21 AM

## 2015-11-30 NOTE — Consult Note (Signed)
ELECTROPHYSIOLOGY CONSULT NOTE    Patient ID: Donna Dickson MRN: [id number], DOB/AGE: 01-25-1951 65 y.o.  Admit date: 11/28/2015 Date of Consult: 11/30/2015  Primary Physician: Houston Siren, MD Primary Cardiologist: Dr. Aundra Dubin  Reason for Consultation: PVC's, NSVT  HPI: Donna Dickson is a 65 y.o. female came to [facility] 21/1/17 with about 2 days of c/o new onset chest pressure and on the day of admission some left arm discomfort as well.  She noted palpitations about 2 weeks ago and feeling unusually SOB with/without exertion, seemed episodic to her.  She reports at this time feeling well without symptoms of any kind.  She could not identify any particular trigger for her symptoms, said that if she was busy and not really paying attention didn't notice it so much but when she was quite, taking note she would feel the palpitations.  This became more noted the few days prior to her coming in, and when she developed the chest discomfort feltlike she knew something was wrong or different and came in.  She denies any changes recently personally, no new activities, medications. She has not had syncope or near syncope.  Past Medical History  Diagnosis Date  . 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 Colon surgery    . Appendectomy    . Tonsillectomy    . Cardiac catheterization N/A 11/30/2015    Procedure: Left Heart Cath and Coronary Angiography;  Surgeon: Leonie Man, MD;  Location: [place] CV LAB;  Service: Cardiovascular;  Laterality: N/A;     Prescriptions prior to admission  Medication Sig Dispense Refill Last Dose  . cholecalciferol (VITAMIN D) 1000 units tablet Take 1,000 Units by mouth daily.   11/27/2015  . thiamine (VITAMIN B-1) 100 MG tablet Take 100 mg by mouth daily.   11/27/2015  . vitamin C (ASCORBIC ACID) 500 MG tablet Take 500 mg by mouth daily.   11/27/2015  . vitamin E 100 UNIT capsule Take 100 Units by mouth  daily.   11/27/2015    Inpatient Medications:  . aspirin EC  81 mg Oral Daily  . atorvastatin  80 mg Oral q1800  . flecainide  50 mg Oral Q12H  . heparin  5,000 Units Subcutaneous 3 times per day  . metoprolol tartrate  12.5 mg Oral BID  . sodium chloride flush  3 mL Intravenous Q12H  . sodium chloride flush  3 mL Intravenous Q12H    Allergies: No Known Allergies  Social History   Social History  . Marital Status: Married    Spouse Name: N/A  . Number of Children: N/A  . Years of Education: N/A   Occupational History  . Not on file.   Social History Main Topics  . Smoking status: Never Smoker   . Smokeless tobacco: Never Used  . Alcohol Use: Yes     Comment: rare  . Drug Use: No  . Sexual Activity: Not on file   Other Topics Concern  . Not on file   Social History Narrative     History reviewed. No pertinent family history.   Review of Systems: All other systems reviewed and are otherwise negative except as noted above.  Physical Exam: Filed Vitals:   11/30/15 1210 11/30/15 1451 11/30/15 1515 11/30/15 1626  BP: 79/44 79/42 83/54  93/54  Pulse: 62 61    Temp:  96.4 F (35.8 C)    TempSrc:  Oral  Resp: 16     Height:      Weight:      SpO2: 100% 100%      GEN- The patient is thin, well appearing, alert and oriented x 3 today.   HEENT: normocephalic, atraumatic; sclera clear, conjunctiva pink; hearing intact; oropharynx clear; neck supple, no JVP Lymph- no cervical lymphadenopathy Lungs- Clear to ausculation bilaterally, normal work of breathing.  No wheezes, rales, rhonchi Heart- Regular rate and rhythm, no murmurs, rubs or gallops, PMI not laterally displaced GI- soft, non-tender, non-distended Extremities- no clubbing, cyanosis, or edema MS- no significant deformity or atrophy Skin- warm and dry, no rash or lesion Psych- euthymic mood, full affect Neuro- no gross deficits observed  Labs:   Lab Results  Component Value Date   WBC 5.3  11/30/2015   HGB 12.7 11/30/2015   HCT 36.9 11/30/2015   MCV 90.7 11/30/2015   PLT 140* 11/30/2015    Recent Labs Lab 11/28/15 1710  11/29/15 0249 11/30/15 1442  NA 138  --  145  --   K 3.4*  --  4.1  --   CL 105  --  112*  --   CO2 25  --  28  --   BUN 13  --  10  --   CREATININE 0.63  < > 0.74 0.70  CALCIUM 9.4  --  9.0  --   PROT 7.9  --   --   --   BILITOT 1.8*  --   --   --   ALKPHOS 69  --   --   --   ALT 22  --   --   --   AST 28  --   --   --   GLUCOSE 87  --  96  --   < > = values in this interval not displayed.     Dg Chest 2 View 11/28/2015  CLINICAL DATA:  Chest pain EXAM: CHEST  2 VIEW COMPARISON:  None. FINDINGS: Normal heart size. Normal mediastinal contour. No pneumothorax. No pleural effusion. Lungs appear clear, with no acute consolidative airspace disease and no pulmonary edema. Pectus excavatum deformity. IMPRESSION: No active cardiopulmonary disease. Pectus excavatum deformity. Electronically Signed   By: Ilona Sorrel M.D.   On: 11/28/2015 17:16   Nm Myocar Multi W/spect W/wall Motion / Ef 11/29/2015   Blood pressure demonstrated a hypotensive response to exercise.  Downsloping ST segment depression ST segment depression of 1 mm was noted during stress in the aVF, III, II, V5 and V6 leads.  Defect 1: There is a small defect of mild severity present in the basal inferoseptal location.  This is a low risk study.  Low risk stress nuclear study with a mild basal septal perfusion defect, that is mostly fixed. This may represent an "LBBB" type artifact related to incessant monomorphic PVCs/VT with LBBB morphology. Otherwise, there is normal perfusion and normal left ventricular regional and global systolic function.    11/30/15: LHC Conclusion    1. There is hyperdynamic left ventricular systolic function. 2. Angiographically normal coronary arteries with a right dominant system   No angiographic evidence of any coronary disease. This would suggest that her  nonsustained ventricular tachycardia is not due to coronary ischemia.    11/30/15: Echocardiogram  Study Conclusions - Left ventricle: The cavity size was normal. Wall thickness was normal. Systolic function was normal. The estimated ejection fraction was in the range of 60% to 65%. Wall motion was normal; there were  no regional wall motion abnormalities. - Aortic valve: There was mild regurgitation. - Mitral valve: There was mild regurgitation.   EKG: SR, nonspecific changes EKGs show PVC's, couplets, bigemeny, trigemenal pattern TELEMETRY: SR, PVC's, NSVT episodes   Assessment and Plan:  1. Palpitations     PVC's, NSVT     She was started on very low dose BB     No CAD by cath today     LVEF 60-65% by echo 11/30/15     BP is very limiting     Start Flecainide 50mg  BID   Signed, Tommye Standard, PA-C 11/30/2015 6:08 PM   I have seen and examined this patient with Tommye Standard.  Agree with above, note added to reflect my findings.  On exam, irregular rhythm, no murmurs, lungs clear.  Is having PVCs that appear to be coming from the RVOT based on her ECG.  She is feeling well with them.  Unfortunately, her BP is low which limits treatment.  Praveen Coia therefore start her on flecainide for suppression of the PVCs in addition to her metoprolol.  Malori Myers plan on possible outpatient ablation.  Zenaida Tesar monitor her overnight and possibly discharge in the AM.  Jammi Morrissette M. Kenijah Benningfield MD 11/30/2015 6:32 PM

## 2015-12-01 DIAGNOSIS — E876 Hypokalemia: Secondary | ICD-10-CM

## 2015-12-01 MED ORDER — ASPIRIN 81 MG PO TBEC: 81.0000 mg | DELAYED_RELEASE_TABLET | Freq: Every day | ORAL | Status: DC

## 2015-12-01 MED ORDER — METOPROLOL TARTRATE 25 MG PO TABS: 12.5000 mg | ORAL_TABLET | Freq: Two times a day (BID) | ORAL | Status: DC

## 2015-12-01 MED ORDER — FLECAINIDE ACETATE 50 MG PO TABS: 50.0000 mg | ORAL_TABLET | Freq: Two times a day (BID) | ORAL | Status: DC

## 2015-12-01 NOTE — Progress Notes (Signed)
CARDIAC REHAB PHASE I   Pt ambulated independently with family without any complaints of SOB, dizziness or chest pain. Checked vital BP 84/50 manually and rechecked after 43minutes with automatic 91/47. No education needs identified at this time. Pt not appripriate for CRPII at this time. Will sign off.  [phone] Donna Dickson D Breeona Waid,MS,ACSM-RCEP 12/01/2015 10:04 AM

## 2015-12-01 NOTE — Progress Notes (Signed)
SUBJECTIVE: The patient is doing well today.  At this time, she denies chest pain, shortness of breath, or any new concerns.  Started on flecainide yesterday for RVOT PVCs with a significant decrease in the burden.  Marland Kitchen aspirin EC  81 mg Oral Daily  . atorvastatin  80 mg Oral q1800  . flecainide  50 mg Oral Q12H  . heparin  5,000 Units Subcutaneous 3 times per day  . metoprolol tartrate  12.5 mg Oral BID  . sodium chloride flush  3 mL Intravenous Q12H  . sodium chloride flush  3 mL Intravenous Q12H   . sodium chloride 75 mL/hr at 11/29/15 1808    OBJECTIVE: Physical Exam: Filed Vitals:   11/30/15 2300 12/01/15 0220 12/01/15 0400 12/01/15 0640  BP: 87/46 89/54 85/56  94/53  Pulse: 62 84 68 66  Temp:   98.2 F (36.8 C)   TempSrc:   Oral   Resp: 16 20 18 18   Height:      Weight:   117 lb 4.6 oz (53.2 kg)   SpO2: 99% 97% 99%     Intake/Output Summary (Last 24 hours) at 12/01/15 [id number] Last data filed at 12/01/15 0500  Gross per 24 hour  Intake    243 ml  Output    100 ml  Net    143 ml    Telemetry reveals sinus rhythm  GEN- The patient is well appearing, alert and oriented x 3 today.   Head- normocephalic, atraumatic Eyes-  Sclera clear, conjunctiva pink Ears- hearing intact Oropharynx- clear Neck- supple, no JVP Lymph- no cervical lymphadenopathy Lungs- Clear to ausculation bilaterally, normal work of breathing Heart- Regular rate and rhythm, no murmurs, rubs or gallops, PMI not laterally displaced GI- soft, NT, ND, + BS Extremities- no clubbing, cyanosis, or edema Skin- no rash or lesion Psych- euthymic mood, full affect Neuro- strength and sensation are intact  LABS: Basic Metabolic Panel:  Recent Labs  11/28/15 1710 11/28/15 2138 11/29/15 0249 11/30/15 1442  NA 138  --  145  --   K 3.4*  --  4.1  --   CL 105  --  112*  --   CO2 25  --  28  --   GLUCOSE 87  --  96  --   BUN 13  --  10  --   CREATININE 0.63 0.69 0.74 0.70  CALCIUM 9.4  --  9.0  --     MG  --  2.0  --   --    Liver Function Tests:  Recent Labs  11/28/15 1710  AST 28  ALT 22  ALKPHOS 69  BILITOT 1.8*  PROT 7.9  ALBUMIN 4.7   No results for input(s): LIPASE, AMYLASE in the last 72 hours. CBC:  Recent Labs  11/29/15 0249 11/30/15 1442  WBC 4.1 5.3  HGB 11.9* 12.7  HCT 35.7* 36.9  MCV 91.1 90.7  PLT 168 140*   Cardiac Enzymes:  Recent Labs  11/28/15 2138 11/29/15 0249 11/29/15 1143  TROPONINI <0.03 <0.03 <0.03   BNP: Invalid input(s): POCBNP D-Dimer: No results for input(s): DDIMER in the last 72 hours. Hemoglobin A1C: No results for input(s): HGBA1C in the last 72 hours. Fasting Lipid Panel:  Recent Labs  11/29/15 0249  CHOL 136  HDL 73  LDLCALC 53  TRIG 49  CHOLHDL 1.9   Thyroid Function Tests:  Recent Labs  11/28/15 2138  TSH 4.759*   Anemia Panel: No results for input(s): VITAMINB12, FOLATE, FERRITIN,  TIBC, IRON, RETICCTPCT in the last 72 hours.  RADIOLOGY: Dg Chest 2 View  11/28/2015  CLINICAL DATA:  Chest pain EXAM: CHEST  2 VIEW COMPARISON:  None. FINDINGS: Normal heart size. Normal mediastinal contour. No pneumothorax. No pleural effusion. Lungs appear clear, with no acute consolidative airspace disease and no pulmonary edema. Pectus excavatum deformity. IMPRESSION: No active cardiopulmonary disease. Pectus excavatum deformity. Electronically Signed   By: Ilona Sorrel M.D.   On: 11/28/2015 17:16   Nm Myocar Multi W/spect W/wall Motion / Ef  11/29/2015   Blood pressure demonstrated a hypotensive response to exercise.  Downsloping ST segment depression ST segment depression of 1 mm was noted during stress in the aVF, III, II, V5 and V6 leads.  Defect 1: There is a small defect of mild severity present in the basal inferoseptal location.  This is a low risk study.  Low risk stress nuclear study with a mild basal septal perfusion defect, that is mostly fixed. This may represent an "LBBB" type artifact related to incessant  monomorphic PVCs/VT with LBBB morphology. Otherwise, there is normal perfusion and normal left ventricular regional and global systolic function.    ASSESSMENT AND PLAN:  Principal Problem:   Chest pain Active Problems:   Unstable angina (HCC)   NSVT (nonsustained ventricular tachycardia) (HCC)  NSVT: was started on flecainide yesterday 50 mg with a significant decrease in her PVCs which appear to be coming from the RVOT.  Also on metoprolol.  Had cath with normal coronaries and TTE with normal EF.  Marston Mccadden plan for discharge today on flecainide and metoprolol for supression of PVCs and VT.  She Tamani Durney have ablation done, Mathias Bogacki be called next week for scheduling.  Kennedey Digilio Meredith Leeds, MD 12/01/2015 8:32 AM

## 2015-12-01 NOTE — Progress Notes (Signed)
Pt discharged to home, condition stable, printed education given and explained for new medications, educated reasons to return to ED, encouraged pt to take own BP daily, pt and family members verbalized understanding of instruction, left floor via w/c accompanied by family members.  Edward Qualia RN

## 2015-12-01 NOTE — Discharge Summary (Signed)
Discharge Summary    Patient ID: Donna Dickson,  MRN: [id number], DOB/AGE: 1951/06/29 65 y.o.  Admit date: 11/28/2015 Discharge date: 12/01/2015  Primary Care Provider: [facility] Primary Cardiologist: [place]  Discharge Diagnoses    Principal Problem:   Chest pain Active Problems:   Unstable angina (HCC)   NSVT (nonsustained ventricular tachycardia) (HCC)   Hypokalemia    Allergies No Known Allergies  Diagnostic Studies/Procedures   Echocardiogram Study Conclusions  - Left ventricle: The cavity size was normal. Wall thickness was normal. Systolic function was normal. The estimated ejection fraction was in the range of 60% to 65%. Wall motion was normal; there were no regional wall motion abnormalities. - Aortic valve: There was mild regurgitation. - Mitral valve: There was mild regurgitation.  Procedures    Left Heart Cath and Coronary Angiography    Conclusion    1. There is hyperdynamic left ventricular systolic function. 2. Angiographically normal coronary arteries with a right dominant system   No angiographic evidence of any coronary disease. This would suggest that her nonsustained ventricular tachycardia is not due to coronary ischemia.  Recommendations:  Transfer back to nursing unit after sheath removal in PACU holding area.  Consult Electrophysiology for evaluation and treatment/management    _____________   History of Present Illness     65 yo with no significant past history presents for evaluation of chest pain and NSVT. Patient has been generally healthy and takes no medications. About 2 weeks ago, she began to notice episodes of dyspnea that would last for a few minutes. They were not clearly related to exertion and sometimes occurred at rest. These would happen daily. She did not note palpitations. Starting about 2 days ago, she developed mild substernal tightness. This has been constant now for about 2 days. It is still  present. Today, she developed tingling in her left forearm so went to the ER. No notes are available yet from the ER, but apparently she had a run of 11 beats of NSVT. She has had frequent PVCs. K is low at 3.4. Troponin initially was normal. ECG showed nonspecific ST depression inferiorly and anterolaterally, and ?prior lateral MI.   Currently, has very "minor" chest tightness.   She has never smoked. Has coronary disease in her family.   Hospital Course     Patient was admitted and ruled out for MI. She did undergo a treadmill Cardiolite study during which time she had NSVT of 20 beats frequent PVCs and ventricular bigeminy.  The study was read as low risk. She also underwent left heart catheterization which revealed angiographically normal coronary arteries. She was initially started on aspirin and high-dose statin. Both of these of been discontinued. She was also started on metoprolol 12.5 mg twice daily. She continued to have an NSVT on telemetry.  Potassium was mildly low and was replaced. It was normal on follow-up labs. Magnesium was 2.0.  A 2-D echocardiogram which was essentially normal with only mild AI and MR.  She was seen by electrophysiology and placed on flecainide 50mg  for suppression of PVCs. Low dose of metoprolol was continued. We're not able to titrate this because of her lower blood pressure.  She'll be scheduled for ablation in the near future. TSH was mildly elevated at 4.759. The patient was seen by Dr. Curt Bears who felt she was stable for DC home.   Consultants: EP, cardiac rehabilitation  _____________  Discharge Vitals Blood pressure 94/53, pulse 66, temperature 98.2 F (36.8 C), temperature  source Oral, resp. rate 18, height 5\' 3"  (1.6 m), weight 117 lb 4.6 oz (53.2 kg), SpO2 99 %.  Filed Weights   11/29/15 0605 11/30/15 0421 12/01/15 0400  Weight: 115 lb 11.2 oz (52.481 kg) 116 lb 11.2 oz (52.935 kg) 117 lb 4.6 oz (53.2 kg)    Labs & Radiologic Studies       CBC  Recent Labs  11/29/15 0249 11/30/15 1442  WBC 4.1 5.3  HGB 11.9* 12.7  HCT 35.7* 36.9  MCV 91.1 90.7  PLT 168 [id number]*   Basic Metabolic Panel  Recent Labs  11/28/15 1710 11/28/15 2138 11/29/15 0249 11/30/15 1442  NA 138  --  145  --   K 3.4*  --  4.1  --   CL 105  --  112*  --   CO2 25  --  28  --   GLUCOSE 87  --  96  --   BUN 13  --  10  --   CREATININE 0.63 0.69 0.74 0.70  CALCIUM 9.4  --  9.0  --   MG  --  2.0  --   --    Liver Function Tests  Recent Labs  11/28/15 1710  AST 28  ALT 22  ALKPHOS 69  BILITOT 1.8*  PROT 7.9  ALBUMIN 4.7   No results for input(s): LIPASE, AMYLASE in the last 72 hours. Cardiac Enzymes  Recent Labs  11/28/15 2138 11/29/15 0249 11/29/15 1143  TROPONINI <0.03 <0.03 <0.03   BNP Invalid input(s): POCBNP D-Dimer No results for input(s): DDIMER in the last 72 hours. Hemoglobin A1C No results for input(s): HGBA1C in the last 72 hours. Fasting Lipid Panel  Recent Labs  11/29/15 0249  CHOL 136  HDL 73  LDLCALC 53  TRIG 49  CHOLHDL 1.9   Thyroid Function Tests  Recent Labs  11/28/15 2138  TSH 4.759*    Dg Chest 2 View  11/28/2015  CLINICAL DATA:  Chest pain EXAM: CHEST  2 VIEW COMPARISON:  None. FINDINGS: Normal heart size. Normal mediastinal contour. No pneumothorax. No pleural effusion. Lungs appear clear, with no acute consolidative airspace disease and no pulmonary edema. Pectus excavatum deformity. IMPRESSION: No active cardiopulmonary disease. Pectus excavatum deformity. Electronically Signed   By: Ilona Sorrel M.D.   On: 11/28/2015 17:16   Nm Myocar Multi W/spect W/wall Motion / Ef  11/29/2015   Blood pressure demonstrated a hypotensive response to exercise.  Downsloping ST segment depression ST segment depression of 1 mm was noted during stress in the aVF, III, II, V5 and V6 leads.  Defect 1: There is a small defect of mild severity present in the basal inferoseptal location.  This is a low risk  study.  Low risk stress nuclear study with a mild basal septal perfusion defect, that is mostly fixed. This may represent an "LBBB" type artifact related to incessant monomorphic PVCs/VT with LBBB morphology. Otherwise, there is normal perfusion and normal left ventricular regional and global systolic function.    Disposition   Pt is being discharged home today in good condition.  Follow-up Plans & Appointments    Follow-up Information    Follow up with Rainey Kahrs Meredith Leeds, MD.   Specialty:  Cardiology   Why:  The office Avalon Coppinger call you with your follow up appointment date and time   Contact information:   [address] [place] [address] [phone]      Discharge Instructions    Diet - low  sodium heart healthy    Complete by:  As directed      Increase activity slowly    Complete by:  As directed            Discharge Medications   Current Discharge Medication List    START taking these medications   Details  flecainide (TAMBOCOR) 50 MG tablet Take 1 tablet (50 mg total) by mouth every 12 (twelve) hours. Qty: 60 tablet, Refills: 11    metoprolol tartrate (LOPRESSOR) 25 MG tablet Take 0.5 tablets (12.5 mg total) by mouth 2 (two) times daily. Qty: 60 tablet, Refills: 5      CONTINUE these medications which have NOT CHANGED   Details  cholecalciferol (VITAMIN D) 1000 units tablet Take 1,000 Units by mouth daily.    thiamine (VITAMIN B-1) 100 MG tablet Take 100 mg by mouth daily.    vitamin C (ASCORBIC ACID) 500 MG tablet Take 500 mg by mouth daily.    vitamin E 100 UNIT capsule Take 100 Units by mouth daily.           Outstanding Labs/Studies     Duration of Discharge Encounter   Greater than 30 minutes including physician time.  Otilio Connors PAC 12/01/2015, 11:12 AM    I have seen and examined this patient with Luisa Dago on 12/01/15.  Agree with above, note added to reflect my findings.  On exam, regular rhythm, no murmurs, lungs  clear.  Was having PVCs throughout her  Hospital stay that appeared to be coming from the right ventricular outflow tract.  Had a normal TTE and a cath without disease.  Placed on Flecainide and metoprolol with improvement in her PVC burden.  Erastus Bartolomei plan to bring her back for ablation later this month.      Cora Stetson M. Dillie Burandt MD 12/02/2015 7:47 AM

## 2015-12-01 NOTE — Discharge Instructions (Signed)

## 2015-12-03 ENCOUNTER — Encounter (HOSPITAL_COMMUNITY): Payer: Self-pay | Admitting: Cardiology

## 2015-12-03 ENCOUNTER — Telehealth: Payer: Self-pay | Admitting: *Deleted

## 2015-12-03 NOTE — Telephone Encounter (Addendum)
Called patient to schedule PVC ablation, per Dr. Curt Bears. Instructed patient she is scheduled for 2/23.  Pre procedure labs to be done in the hospital the morning of the procedure. Instructed to arrive at [facility] hospital at 5:30 am. NPO after MN, hold medications the morning of procedure. Hold Flecainide 2 days prior to procedure - take last doses on 12/17/15. Advised that office will contact her to arrange post procedure f/u appts. Letter of  Instructions mailed to home address. Patient verbalized understanding and agreeable to plan.

## 2015-12-06 ENCOUNTER — Other Ambulatory Visit: Payer: Self-pay | Admitting: *Deleted

## 2015-12-06 ENCOUNTER — Encounter: Payer: Self-pay | Admitting: *Deleted

## 2015-12-06 DIAGNOSIS — I493 Ventricular premature depolarization: Secondary | ICD-10-CM

## 2015-12-06 DIAGNOSIS — Z01812 Encounter for preprocedural laboratory examination: Secondary | ICD-10-CM

## 2015-12-06 DIAGNOSIS — R0989 Other specified symptoms and signs involving the circulatory and respiratory systems: Secondary | ICD-10-CM

## 2015-12-07 ENCOUNTER — Other Ambulatory Visit: Payer: Self-pay | Admitting: Physician Assistant

## 2015-12-07 ENCOUNTER — Telehealth: Payer: Self-pay | Admitting: Physician Assistant

## 2015-12-07 MED ORDER — AMOXICILLIN-POT CLAVULANATE 875-125 MG PO TABS: 1.0000 | ORAL_TABLET | Freq: Two times a day (BID) | ORAL | Status: DC

## 2015-12-07 NOTE — Telephone Encounter (Signed)
    Patient called the after-hours answering service about a medication question. She is on flecainide for PVCs and V. tach. She got a UTI and was placed on Cipro. She was worried about QT prolongation. I called the urgent care where she was seen to get a list of sensitivities. The UTI is sensitive to Augmentin so I have called this into her pharmacy.  Angelena Form PA-C  [facility]

## 2015-12-14 ENCOUNTER — Telehealth: Payer: Self-pay | Admitting: Cardiology

## 2015-12-14 NOTE — Telephone Encounter (Signed)
Patient was wondering if we pre-cert with insurance before procedure.  But explains she already has spoken with insurance and gotten answer to this question. She thanks me for calling back.

## 2015-12-14 NOTE — Telephone Encounter (Signed)
Please call,she did not give any details.

## 2015-12-20 ENCOUNTER — Ambulatory Visit (HOSPITAL_COMMUNITY): Payer: 59 | Admitting: Anesthesiology

## 2015-12-20 ENCOUNTER — Encounter (HOSPITAL_COMMUNITY)
Admission: RE | Disposition: A | Discharge status: Home / Self Care | Payer: Self-pay | Source: Ambulatory Visit | Attending: Cardiology

## 2015-12-20 ENCOUNTER — Ambulatory Visit (HOSPITAL_COMMUNITY)
Admission: RE | Admit: 2015-12-20 | Discharge: 2015-12-20 | Disposition: A | Discharge status: Home / Self Care | Payer: 59 | Source: Ambulatory Visit | Attending: Cardiology | Admitting: Cardiology

## 2015-12-20 ENCOUNTER — Encounter (HOSPITAL_COMMUNITY): Payer: Self-pay | Admitting: Cardiology

## 2015-12-20 DIAGNOSIS — I493 Ventricular premature depolarization: Secondary | ICD-10-CM | POA: Diagnosis present

## 2015-12-20 HISTORY — PX: ELECTROPHYSIOLOGIC STUDY: SHX172A

## 2015-12-20 HISTORY — DX: Ventricular premature depolarization: I49.3

## 2015-12-20 LAB — BASIC METABOLIC PANEL
Anion gap: 14 (ref 5–15)
BUN: 11 mg/dL (ref 6–20)
CHLORIDE: 107 mmol/L (ref 101–111)
CO2: 25 mmol/L (ref 22–32)
Calcium: 9.4 mg/dL (ref 8.9–10.3)
Creatinine, Ser: 0.79 mg/dL (ref 0.44–1.00)
GFR calc Af Amer: >60 mL/min (ref >60)
GLUCOSE: 90 mg/dL (ref 65–99)
POTASSIUM: 4 mmol/L (ref 3.5–5.1)
Sodium: 146 mmol/L — ABNORMAL HIGH (ref 135–145)

## 2015-12-20 LAB — CBC
HCT: 40.1 % (ref 36.0–46.0)
Hemoglobin: 13.2 g/dL (ref 12.0–15.0)
MCH: 29.9 pg (ref 26.0–34.0)
MCHC: 32.9 g/dL (ref 30.0–36.0)
MCV: 90.9 fL (ref 78.0–100.0)
PLATELETS: 192 10*3/uL (ref 150–400)
RBC: 4.41 MIL/uL (ref 3.87–5.11)
RDW: 13 % (ref 11.5–15.5)
WBC: 4.1 10*3/uL (ref 4.0–10.5)

## 2015-12-20 LAB — POCT ACTIVATED CLOTTING TIME: ACTIVATED CLOTTING TIME: 183 s

## 2015-12-20 SURGERY — V TACH ABLATION
Anesthesia: Monitor Anesthesia Care

## 2015-12-20 MED ORDER — METOPROLOL TARTRATE 12.5 MG HALF TABLET
12.5000 mg | ORAL_TABLET | Freq: Two times a day (BID) | ORAL | Status: DC
  Administered 2015-12-20: 12.5 mg via ORAL
  Filled 2015-12-20: qty 1

## 2015-12-20 MED ORDER — VITAMIN E 45 MG (100 UNIT) PO CAPS
100.0000 [IU] | ORAL_CAPSULE | Freq: Every day | ORAL | Status: DC
Start: 2015-12-20 — End: 2015-12-20
  Filled 2015-12-20: qty 1

## 2015-12-20 MED ORDER — SODIUM CHLORIDE 0.9 % IV SOLN
INTRAVENOUS | Status: DC
  Administered 2015-12-20: 1000 mL via INTRAVENOUS

## 2015-12-20 MED ORDER — HEPARIN (PORCINE) IN NACL 2-0.9 UNIT/ML-% IJ SOLN
INTRAMUSCULAR | Status: AC
  Filled 2015-12-20: qty 500

## 2015-12-20 MED ORDER — SODIUM CHLORIDE 0.9 % IV SOLN: 250.0000 mL | INTRAVENOUS | Status: DC | PRN

## 2015-12-20 MED ORDER — BUPIVACAINE HCL (PF) 0.25 % IJ SOLN
INTRAMUSCULAR | Status: AC
  Filled 2015-12-20: qty 30

## 2015-12-20 MED ORDER — VITAMIN D 1000 UNITS PO TABS: 1000.0000 [IU] | ORAL_TABLET | Freq: Every day | ORAL | Status: DC

## 2015-12-20 MED ORDER — HEPARIN SODIUM (PORCINE) 1000 UNIT/ML IJ SOLN
INTRAMUSCULAR | Status: DC | PRN
  Administered 2015-12-20: 5000 [IU] via INTRAVENOUS

## 2015-12-20 MED ORDER — ONDANSETRON HCL 4 MG/2ML IJ SOLN: 4.0000 mg | Freq: Four times a day (QID) | INTRAMUSCULAR | Status: DC | PRN

## 2015-12-20 MED ORDER — DEXTROSE 5 % IV SOLN
10.0000 mg | INTRAVENOUS | Status: DC | PRN
  Administered 2015-12-20: 10 ug/min via INTRAVENOUS

## 2015-12-20 MED ORDER — MEPERIDINE HCL 25 MG/ML IJ SOLN: 6.2500 mg | INTRAMUSCULAR | Status: DC | PRN

## 2015-12-20 MED ORDER — VITAMIN C 500 MG PO TABS: 500.0000 mg | ORAL_TABLET | Freq: Every day | ORAL | Status: DC

## 2015-12-20 MED ORDER — HYDROMORPHONE HCL 1 MG/ML IJ SOLN: 0.2500 mg | INTRAMUSCULAR | Status: DC | PRN

## 2015-12-20 MED ORDER — FENTANYL CITRATE (PF) 100 MCG/2ML IJ SOLN
INTRAMUSCULAR | Status: DC | PRN
Start: 2015-12-20 — End: 2015-12-20
  Administered 2015-12-20 (×2): 50 ug via INTRAVENOUS
  Administered 2015-12-20: 25 ug via INTRAVENOUS
  Administered 2015-12-20: 50 ug via INTRAVENOUS
  Administered 2015-12-20: 25 ug via INTRAVENOUS
  Administered 2015-12-20: 50 ug via INTRAVENOUS

## 2015-12-20 MED ORDER — BUPIVACAINE HCL (PF) 0.25 % IJ SOLN
INTRAMUSCULAR | Status: DC | PRN
  Administered 2015-12-20: 30 mL

## 2015-12-20 MED ORDER — ACETAMINOPHEN 325 MG PO TABS: 650.0000 mg | ORAL_TABLET | ORAL | Status: DC | PRN

## 2015-12-20 MED ORDER — VITAMIN B-1 100 MG PO TABS: 100.0000 mg | ORAL_TABLET | Freq: Every day | ORAL | Status: DC

## 2015-12-20 MED ORDER — ONDANSETRON HCL 4 MG/2ML IJ SOLN: 4.0000 mg | Freq: Once | INTRAMUSCULAR | Status: DC | PRN

## 2015-12-20 MED ORDER — PHENYLEPHRINE HCL 10 MG/ML IJ SOLN
INTRAMUSCULAR | Status: DC | PRN
  Administered 2015-12-20: 60 ug via INTRAVENOUS

## 2015-12-20 MED ORDER — LIDOCAINE HCL (CARDIAC) 20 MG/ML IV SOLN
INTRAVENOUS | Status: DC | PRN
  Administered 2015-12-20: 40 mg via INTRAVENOUS

## 2015-12-20 MED ORDER — LACTATED RINGERS IV SOLN
INTRAVENOUS | Status: DC | PRN
  Administered 2015-12-20: 11:00:00 via INTRAVENOUS

## 2015-12-20 MED ORDER — HEPARIN (PORCINE) IN NACL 2-0.9 UNIT/ML-% IJ SOLN
INTRAMUSCULAR | Status: DC | PRN
  Administered 2015-12-20: 09:00:00

## 2015-12-20 MED ORDER — HEPARIN SODIUM (PORCINE) 1000 UNIT/ML IJ SOLN
INTRAMUSCULAR | Status: DC | PRN
  Administered 2015-12-20: 1000 [IU] via INTRAVENOUS

## 2015-12-20 MED ORDER — HEPARIN (PORCINE) IN NACL 2-0.9 UNIT/ML-% IJ SOLN
INTRAMUSCULAR | Status: DC | PRN
  Administered 2015-12-20: 500 mL

## 2015-12-20 MED ORDER — DEXTROSE 5 % IV SOLN
1000.0000 ug | INTRAVENOUS | Status: DC | PRN
  Administered 2015-12-20: 2 ug/min via INTRAVENOUS

## 2015-12-20 MED ORDER — MIDAZOLAM HCL 5 MG/5ML IJ SOLN
INTRAMUSCULAR | Status: DC | PRN
  Administered 2015-12-20: 2 mg via INTRAVENOUS

## 2015-12-20 MED ORDER — SODIUM CHLORIDE 0.9% FLUSH: 3.0000 mL | Freq: Two times a day (BID) | INTRAVENOUS | Status: DC

## 2015-12-20 MED ORDER — DIPHENHYDRAMINE HCL 50 MG/ML IJ SOLN
INTRAMUSCULAR | Status: DC | PRN
  Administered 2015-12-20: 25 mg via INTRAVENOUS

## 2015-12-20 MED ORDER — SODIUM CHLORIDE 0.9% FLUSH: 3.0000 mL | INTRAVENOUS | Status: DC | PRN

## 2015-12-20 MED ORDER — PROPOFOL 500 MG/50ML IV EMUL
INTRAVENOUS | Status: DC | PRN
  Administered 2015-12-20: 40 ug/kg/min via INTRAVENOUS

## 2015-12-20 MED ORDER — EPHEDRINE SULFATE 50 MG/ML IJ SOLN
INTRAMUSCULAR | Status: DC | PRN
  Administered 2015-12-20: 5 mg via INTRAVENOUS

## 2015-12-20 MED ORDER — FLECAINIDE ACETATE 50 MG PO TABS
50.0000 mg | ORAL_TABLET | Freq: Two times a day (BID) | ORAL | Status: DC
  Administered 2015-12-20: 50 mg via ORAL
  Filled 2015-12-20 (×2): qty 1

## 2015-12-20 SURGICAL SUPPLY — 17 items
BAG SNAP BAND KOVER 36X36 (MISCELLANEOUS) ×3 IMPLANT
BLANKET WARM UNDERBOD FULL ACC (MISCELLANEOUS) ×3 IMPLANT
CATH JOSEPH QUAD ALLRED 6F REP (CATHETERS) ×3 IMPLANT
CATH NAVISTAR SMARTTOUCH DF (ABLATOR) ×3 IMPLANT
CATH SOUNDSTAR 3D IMAGING (CATHETERS) ×3 IMPLANT
COVER SWIFTLINK CONNECTOR (BAG) ×3 IMPLANT
LINE ARTERIAL PRESSURE 36 MF (TUBING) ×3 IMPLANT
PACK EP LATEX FREE (CUSTOM PROCEDURE TRAY) ×2
PACK EP LF (CUSTOM PROCEDURE TRAY) ×1 IMPLANT
PAD DEFIB LIFELINK (PAD) ×3 IMPLANT
PATCH CARTO3 (PAD) ×3 IMPLANT
SHEATH AVANTI 11F 11CM (SHEATH) ×3 IMPLANT
SHEATH PINNACLE 6F 10CM (SHEATH) IMPLANT
SHEATH PINNACLE 7F 10CM (SHEATH) ×3 IMPLANT
SHEATH PINNACLE 8F 10CM (SHEATH) ×6 IMPLANT
SHIELD RADPAD SCOOP 12X17 (MISCELLANEOUS) ×3 IMPLANT
TUBING SMART ABLATE COOLFLOW (TUBING) ×3 IMPLANT

## 2015-12-20 NOTE — Transfer of Care (Signed)
Immediate Anesthesia Transfer of Care Note  Patient: Donna Dickson  Procedure(s) Performed: Procedure(s): PVC Ablation (N/A)  Patient Location: Cath Lab  Anesthesia Type:MAC  Level of Consciousness: awake, alert  and oriented  Airway & Oxygen Therapy: Patient Spontanous Breathing and Patient connected to nasal cannula oxygen  Post-op Assessment: Report given to RN, Post -op Vital signs reviewed and stable and Patient moving all extremities X 4  Post vital signs: Reviewed and stable  Last Vitals:  Filed Vitals:   12/20/15 0540  BP: 117/56  Pulse: 73  Temp: 36.4 C  Resp: 20    Complications: No apparent anesthesia complications

## 2015-12-20 NOTE — Progress Notes (Signed)
Pt ambulated at the conclusion of her bedrest. Both vascular sites remain stable with no complications. Discharge education provided and all questions answered.

## 2015-12-20 NOTE — Progress Notes (Addendum)
Site area: Right groin a 8 french arterial and a 8 french venous sheath was removed by [employer]  Site Prior to Removal:  Level 0  Pressure Applied For 30  MINUTES    Minutes Beginning at 1210p  Manual:   Yes.    Patient Status During Pull:  stable  Post Pull Groin Site:  Level 0  Post Pull Instructions Given:  Yes.    Post Pull Pulses Present:  Yes.    Dressing Applied:  Yes.    Comments:  VS remove stable during sheath pull.

## 2015-12-20 NOTE — Discharge Summary (Signed)
ELECTROPHYSIOLOGY PROCEDURE DISCHARGE SUMMARY    Patient ID: Donna Dickson,  MRN: [id number], DOB/AGE: 1951-01-28 65 y.o.  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  Electrophysiology study and radiofrequency catheter ablation by Dr Curt Bears on 12/20/15.  This study demonstrated sinus rhythm upon presentation; the patient had PVCs with multiple morphologies; successful radiofrequency modification of 3 PVC morphologies; no early apparent complications  Brief HPI:  Donna Dickson is a 65 y.o. female with no significant past medical history who presented to the hospital with chest pressure and had frequent ventricular ectopy and NSVT.  Medical therapy is limited by hypotension.  She was seen by Dr Curt Bears who recommended ablation. Risks, benefits were reviewed with the patient who wished to proceed.   Hospital Course:  Donna Dickson is a 64 y.o. female underwent PVC ablation on 12/19/15.  She was monitored on telemetry post procedure which demonstrated sinus rhythm with PVC's. There were multiple PVC morphologies identified during EPS.  Because of this, Flecainide Dequincy Born be continued at discharge. She was examined by Dr Curt Bears and considered stable for discharge to home. She Christy Friede follow up with Dr Curt Bears in 4 weeks in the office.   Physical Exam: Filed Vitals:   12/20/15 1350 12/20/15 1415 12/20/15 1430 12/20/15 1500  BP: 86/50 100/51 105/70 106/55  Pulse: 91 69 74 69  Temp: 98.4 F (36.9 C)     TempSrc: Oral     Resp: 14 18 26 17   Height:      Weight:      SpO2: 100% 99% 96% 94%     Labs:   Lab Results  Component Value Date   WBC 4.1 12/20/2015   HGB 13.2 12/20/2015   HCT 40.1 12/20/2015   MCV 90.9 12/20/2015   PLT 192 12/20/2015      Recent Labs Lab 12/20/15 0628  NA 146*  K 4.0  CL 107  CO2 25  BUN 11  CREATININE 0.79  CALCIUM 9.4  GLUCOSE 90     Discharge Medications:  Current Discharge Medication List    CONTINUE these medications which have NOT CHANGED   Details  cholecalciferol (VITAMIN D) 1000 units tablet Take 1,000 Units by mouth daily.    flecainide (TAMBOCOR) 50 MG tablet Take 1 tablet (50 mg total) by mouth every 12 (twelve) hours. Qty: 60 tablet, Refills: 11    metoprolol tartrate (LOPRESSOR) 25 MG tablet Take 0.5 tablets (12.5 mg total) by mouth 2 (two) times daily. Qty: 60 tablet, Refills: 5    thiamine (VITAMIN B-1) 100 MG tablet Take 100 mg by mouth daily.    vitamin C (ASCORBIC ACID) 500 MG tablet Take 500 mg by mouth daily.    vitamin E 100 UNIT capsule Take 100 Units by mouth daily.        Disposition: Pt is being discharged home today in good condition. Discharge Instructions    Diet - low sodium heart healthy    Complete by:  As directed      Discharge instructions    Complete by:  As directed   No driving for 3 days. No lifting over 5 lbs for 1 week. No sexual activity for 1 week. You may return to work in 1 week. Keep procedure site clean & dry. If you notice increased pain, swelling, bleeding  or pus, call/return!  You may shower, but no soaking baths/hot tubs/pools for 1 week.     Increase activity slowly    Complete by:  As directed           Follow-up Information    Follow up with Coben Godshall Meredith Leeds, MD On 01/22/2016.   Specialty:  Cardiology   Why:  at 11:15AM    Contact information:   [place] [address] [phone]       Duration of Discharge Encounter: Greater than 30 minutes including physician time.  Signed, Chanetta Marshall, NP 12/20/2015 5:04 PM  I have seen and examined this patient with Chanetta Marshall on 12/19/14.  Agree with above, note added to reflect my findings.  On exam, regular rhythm, no murmurs, lungs clear.  Had  PVC ablation with multiple PVCs during the procedure.  Continuing to have PVCs post procedure.  Put on flecainide with reduction in PVCs.  Breion Novacek plan for discharge on flecainide and metoprolol.    Tuana Hoheisel M. Tihanna Goodson MD 12/21/2015 8:27 AM

## 2015-12-20 NOTE — Anesthesia Procedure Notes (Signed)
Procedure Name: MAC Date/Time: 12/20/2015 7:54 AM Performed by: Mariea Clonts Pre-anesthesia Checklist: Patient identified, Timeout performed, Emergency Drugs available, Suction available and Patient being monitored Patient Re-evaluated:Patient Re-evaluated prior to inductionOxygen Delivery Method: Simple face mask Preoxygenation: Pre-oxygenation with 100% oxygen Intubation Type: IV induction

## 2015-12-20 NOTE — Anesthesia Postprocedure Evaluation (Signed)
Anesthesia Post Note  Patient: Donna Dickson  Procedure(s) Performed: Procedure(s) (LRB): PVC Ablation (N/A)  Patient location during evaluation: PACU Anesthesia Type: General Level of consciousness: awake Pain management: pain level controlled Respiratory status: spontaneous breathing Cardiovascular status: stable Anesthetic complications: no    Last Vitals:  Filed Vitals:   12/20/15 1600 12/20/15 1701  BP: 89/50 80/55  Pulse: 67 66  Temp:    Resp: 16 16    Last Pain: There were no vitals filed for this visit.               EDWARDS,Aronda Burford

## 2015-12-20 NOTE — Anesthesia Preprocedure Evaluation (Signed)
Anesthesia Evaluation  Patient identified by MRN, date of birth, ID band Patient awake    Reviewed: Allergy & Precautions, NPO status , Patient's Chart, lab work & pertinent test results  Airway Mallampati: I  TM Distance: >3 FB Neck ROM: Full    Dental   Pulmonary    Pulmonary exam normal       Cardiovascular Normal cardiovascular exam    Neuro/Psych    GI/Hepatic   Endo/Other    Renal/GU      Musculoskeletal   Abdominal   Peds  Hematology   Anesthesia Other Findings   Reproductive/Obstetrics                             Anesthesia Physical Anesthesia Plan  ASA: III  Anesthesia Plan: MAC   Post-op Pain Management:    Induction: Intravenous  Airway Management Planned: Simple Face Mask  Additional Equipment:   Intra-op Plan:   Post-operative Plan:   Informed Consent: I have reviewed the patients History and Physical, chart, labs and discussed the procedure including the risks, benefits and alternatives for the proposed anesthesia with the patient or authorized representative who has indicated his/her understanding and acceptance.     Plan Discussed with: CRNA and Surgeon  Anesthesia Plan Comments:         Anesthesia Quick Evaluation  

## 2015-12-20 NOTE — H&P (Signed)
SUBJECTIVE: The patient is doing well today. At this time, she denies chest pain, shortness of breath, or any new concerns. Started on flecainide yesterday for RVOT PVCs with a significant decrease in the burden.  Marland Kitchen aspirin EC 81 mg Oral Daily  . atorvastatin 80 mg Oral q1800  . flecainide 50 mg Oral Q12H  . heparin 5,000 Units Subcutaneous 3 times per day  . metoprolol tartrate 12.5 mg Oral BID  . sodium chloride flush 3 mL Intravenous Q12H  . sodium chloride flush 3 mL Intravenous Q12H   . sodium chloride 75 mL/hr at 11/29/15 1808    OBJECTIVE: Physical Exam: Filed Vitals:   11/30/15 2300 12/01/15 0220 12/01/15 0400 12/01/15 0640  BP: 87/46 89/54 85/56  94/53  Pulse: 62 84 68 66  Temp:   98.2 F (36.8 C)   TempSrc:   Oral   Resp: 16 20 18 18   Height:      Weight:   117 lb 4.6 oz (53.2 kg)   SpO2: 99% 97% 99%     Intake/Output Summary (Last 24 hours) at 12/01/15 [id number] Last data filed at 12/01/15 0500  Gross per 24 hour  Intake  243 ml  Output  100 ml  Net  143 ml    Telemetry reveals sinus rhythm  GEN- The patient is well appearing, alert and oriented x 3 today.  Head- normocephalic, atraumatic Eyes- Sclera clear, conjunctiva pink Ears- hearing intact Oropharynx- clear Neck- supple, no JVP Lymph- no cervical lymphadenopathy Lungs- Clear to ausculation bilaterally, normal work of breathing Heart- Regular rate and rhythm, no murmurs, rubs or gallops, PMI not laterally displaced GI- soft, NT, ND, + BS Extremities- no clubbing, cyanosis, or edema Skin- no rash or lesion Psych- euthymic mood, full affect Neuro- strength and sensation are intact  LABS: Basic Metabolic Panel:  Recent Labs (last 2 labs)      Recent Labs  11/28/15 1710 11/28/15 2138 11/29/15 0249 11/30/15 1442  NA 138 --  145 --   K 3.4* --  4.1 --   CL 105 --   112* --   CO2 25 --  28 --   GLUCOSE 87 --  96 --   BUN 13 --  10 --   CREATININE 0.63 0.69 0.74 0.70  CALCIUM 9.4 --  9.0 --   MG --  2.0 --  --      Liver Function Tests:  Recent Labs (last 2 labs)      Recent Labs  11/28/15 1710  AST 28  ALT 22  ALKPHOS 69  BILITOT 1.8*  PROT 7.9  ALBUMIN 4.7      Recent Labs (last 2 labs)     No results for input(s): LIPASE, AMYLASE in the last 72 hours.   CBC:  Recent Labs (last 2 labs)      Recent Labs  11/29/15 0249 11/30/15 1442  WBC 4.1 5.3  HGB 11.9* 12.7  HCT 35.7* 36.9  MCV 91.1 90.7  PLT 168 140*     Cardiac Enzymes:  Recent Labs (last 2 labs)      Recent Labs  11/28/15 2138 11/29/15 0249 11/29/15 1143  TROPONINI <0.03 <0.03 <0.03     BNP:  Recent Labs (last 2 labs)     Invalid input(s): POCBNP   D-Dimer:  Recent Labs (last 2 labs)     No results for input(s): DDIMER in the last 72 hours.   Hemoglobin A1C:  Recent Labs (last 2 labs)     No  results for input(s): HGBA1C in the last 72 hours.   Fasting Lipid Panel:  Recent Labs (last 2 labs)      Recent Labs  11/29/15 0249  CHOL 136  HDL 73  LDLCALC 53  TRIG 49  CHOLHDL 1.9     Thyroid Function Tests:  Recent Labs (last 2 labs)      Recent Labs  11/28/15 2138  TSH 4.759*     Anemia Panel:  Recent Labs (last 2 labs)     No results for input(s): VITAMINB12, FOLATE, FERRITIN, TIBC, IRON, RETICCTPCT in the last 72 hours.    RADIOLOGY:  Imaging Results    Dg Chest 2 View  11/28/2015 CLINICAL DATA: Chest pain EXAM: CHEST 2 VIEW COMPARISON: None. FINDINGS: Normal heart size. Normal mediastinal contour. No pneumothorax. No pleural effusion. Lungs appear clear, with no acute consolidative airspace disease and no pulmonary edema. Pectus excavatum deformity. IMPRESSION: No active cardiopulmonary disease. Pectus excavatum deformity.  Electronically Signed By: Ilona Sorrel M.D. On: 11/28/2015 17:16   Nm Myocar Multi W/spect W/wall Motion / Ef  11/29/2015  Blood pressure demonstrated a hypotensive response to exercise.  Downsloping ST segment depression ST segment depression of 1 mm was noted during stress in the aVF, III, II, V5 and V6 leads.  Defect 1: There is a small defect of mild severity present in the basal inferoseptal location.  This is a low risk study. Low risk stress nuclear study with a mild basal septal perfusion defect, that is mostly fixed. This may represent an "LBBB" type artifact related to incessant monomorphic PVCs/VT with LBBB morphology. Otherwise, there is normal perfusion and normal left ventricular regional and global systolic function.     ASSESSMENT AND PLAN:  Principal Problem:  Chest pain Active Problems:  Unstable angina (HCC)  NSVT (nonsustained ventricular tachycardia) (HCC)  NSVT: was started on flecainide yesterday 50 mg with a significant decrease in her PVCs which appear to be coming from the RVOT. Also on metoprolol. Had cath with normal coronaries and TTE with normal EF. Laekyn Rayos plan for discharge today on flecainide and metoprolol for supression of PVCs and VT. She Brinson Tozzi have ablation done, Wymon Swaney be called next week for scheduling.  Yuliya Nova Meredith Leeds, MD 12/01/2015 8:32 AM          Seen and examined the patient.  Presented to the hospital in January with PVCs likely coming from the RVOT.  Has since been on flecainide with a decrease in her PVCs presenting today for ablation.  Risks and benefits discussed.  Risks include bleeding, infection, tamponade, stroke, and damage to surrounding organs.  Patient understands and agrees to the procedure.  Jacarri Gesner use CARTO mapping and ICE.  Sherronda Sweigert Curt Bears, MD 12/20/2015 7:10 AM

## 2015-12-20 NOTE — Progress Notes (Signed)
Site area: Left groin a 7 and 11 french venous sheath was removed Marjory Lies EMT  Site Prior to Removal:  Level 0  Pressure Applied For 15 MINUTES    Minutes Beginning at 1250p  Manual:   Yes.    Patient Status During Pull:  stable  Post Pull Groin Site:  Level 0  Post Pull Instructions Given:  Yes.    Post Pull Pulses Present:  Yes.    Dressing Applied:  Yes.    Comments:  VS remain stable during sheath pull.

## 2015-12-21 ENCOUNTER — Telehealth: Payer: Self-pay | Admitting: Cardiology

## 2015-12-21 ENCOUNTER — Other Ambulatory Visit: Payer: Self-pay | Admitting: *Deleted

## 2015-12-21 ENCOUNTER — Telehealth: Payer: Self-pay | Admitting: Nurse Practitioner

## 2015-12-21 DIAGNOSIS — I493 Ventricular premature depolarization: Secondary | ICD-10-CM

## 2015-12-21 DIAGNOSIS — Z79899 Other long term (current) drug therapy: Secondary | ICD-10-CM

## 2015-12-21 NOTE — Telephone Encounter (Signed)
   Pts husband called to report that pt had PVC ablation yesterday and today c/o malaise, headache, and low grade fever.  Catheter site is clear of discharge.  No localizing Ss.  She is wondering if she can take anything for it.  I advised that she may take OTC tylenol or aleve for headache and low grade fever (99).  If fever worsens or she develops localizing Ss (sob, pain, dysuria) that she should either f/u with PCP or be seen in urgent care.  Caller verbalized understanding and was grateful for the call back.  Murray Hodgkins, NP 12/21/2015, 7:02 PM

## 2015-12-21 NOTE — Telephone Encounter (Signed)
Please call,did not give any details. She said she just need to talk to the nurse.

## 2015-12-21 NOTE — Telephone Encounter (Signed)
Patient tells me that she just finished up her abx for UTI, but has developed a yeast infection secondary to abx. Reviewed with Dr. Curt Bears. Advised to take OTC Monistat and if this is unsuccessful in treating infection to contact PCP for rx medication. Patient verbalized understanding and agreeable to plan.

## 2015-12-24 ENCOUNTER — Encounter (HOSPITAL_COMMUNITY): Payer: Self-pay | Admitting: Cardiology

## 2015-12-28 ENCOUNTER — Ambulatory Visit (INDEPENDENT_AMBULATORY_CARE_PROVIDER_SITE_OTHER): Payer: 59

## 2015-12-28 DIAGNOSIS — Z79899 Other long term (current) drug therapy: Secondary | ICD-10-CM

## 2015-12-28 DIAGNOSIS — I493 Ventricular premature depolarization: Secondary | ICD-10-CM | POA: Diagnosis not present

## 2015-12-28 LAB — EXERCISE TOLERANCE TEST
CHL CUP RESTING HR STRESS: 62 {beats}/min
CHL CUP STRESS STAGE 1 DBP: 59 mmHg
CHL CUP STRESS STAGE 1 HR: 63 {beats}/min
CHL CUP STRESS STAGE 2 SPEED: 1 mph
CHL CUP STRESS STAGE 3 SPEED: 1 mph
CHL CUP STRESS STAGE 4 DBP: 54 mmHg
CHL CUP STRESS STAGE 4 SPEED: 1.7 mph
CHL CUP STRESS STAGE 5 GRADE: 12 %
CHL CUP STRESS STAGE 5 SBP: 127 mmHg
CHL CUP STRESS STAGE 6 DBP: 61 mmHg
CHL CUP STRESS STAGE 6 GRADE: 14 %
CHL CUP STRESS STAGE 6 HR: 115 {beats}/min
CHL CUP STRESS STAGE 6 SBP: 142 mmHg
CHL CUP STRESS STAGE 6 SPEED: 3.4 mph
CHL CUP STRESS STAGE 8 GRADE: 0 %
CHL CUP STRESS STAGE 8 HR: 75 {beats}/min
CHL CUP STRESS STAGE 8 SBP: 128 mmHg
CHL CUP STRESS STAGE 8 SPEED: 0 mph
CSEPPBP: 142 mmHg
Estimated workload: 10.1 METS
Exercise duration (min): 9 min
Exercise duration (sec): 0 s
MPHR: 158 {beats}/min
Peak HR: 115 {beats}/min
Percent HR: 74 %
Percent of predicted max HR: 72 %
RPE: 16
Stage 1 Grade: 0 %
Stage 1 SBP: 104 mmHg
Stage 1 Speed: 0 mph
Stage 2 Grade: 0 %
Stage 2 HR: 67 {beats}/min
Stage 3 Grade: 0.1 %
Stage 3 HR: 69 {beats}/min
Stage 4 Grade: 10 %
Stage 4 HR: 92 {beats}/min
Stage 4 SBP: 123 mmHg
Stage 5 DBP: 58 mmHg
Stage 5 HR: 104 {beats}/min
Stage 5 Speed: 2.5 mph
Stage 7 DBP: 59 mmHg
Stage 7 Grade: 0 %
Stage 7 HR: 100 {beats}/min
Stage 7 SBP: 129 mmHg
Stage 7 Speed: 0 mph
Stage 8 DBP: 65 mmHg

## 2016-01-04 NOTE — Progress Notes (Signed)
Quick Note:  Continue flecainide, no changes necessary. ______

## 2016-01-10 ENCOUNTER — Encounter (HOSPITAL_COMMUNITY): Payer: Self-pay | Admitting: Cardiology

## 2016-01-10 NOTE — Addendum Note (Signed)
Addendum  created 01/10/16 2335 by Finis Bud, MD   Modules edited: Anesthesia Events

## 2016-01-16 ENCOUNTER — Encounter: Payer: 59 | Admitting: Physician Assistant

## 2016-01-21 NOTE — Progress Notes (Signed)
Electrophysiology Office Note   Date:  01/22/2016   ID:  Donna Dickson, DOB [birth year], MRN [id number]  PCP:  Houston Siren, MD  Primary Electrophysiologist:  Constance Haw, MD    Chief Complaint  Patient presents with  . PVC     History of Present Illness: Donna Dickson is a 65 y.o. female who presents today for electrophysiology evaluation.    She presented to the hospital with PVCs there were 50% or more of her beats. She had a cardiac cath which showed normal coronary arteries and an echocardiogram which was also normal. She was put on flecainide which decreased significantly the number of her PVCs. She was discharged and had an attempt at ablation of her PVCs, but unfortunately she had multiple morphologies at the time of ablation. She continues to be on flecainide with a normal EKG today. She says that she feels well. She does complain of some mild chest discomfort that's is not related to exertion and not related to rest. Discomfort is in the center of her chest. She says that she felt well the week after the ablation but this started after that and has occurred for the last few weeks but has increased to happen every day for last 2-3 days , intermittently.  Today, she denies symptoms of palpitations, shortness of breath, orthopnea, PND, lower extremity edema, claudication, dizziness, presyncope, syncope, bleeding, or neurologic sequela. The patient is tolerating medications without difficulties and is otherwise without complaint today.    Past Medical History  Diagnosis Date  . Cancer of appendix ([place])   . PVC (premature ventricular contraction) dx'd 11/2015   Past Surgical History  Procedure Laterality Date  . Tonsillectomy    . Colon surgery  1992  . Cardiac catheterization N/A 11/30/2015    Procedure: Left Heart Cath and Coronary Angiography;  Surgeon: Leonie Man, MD;  Location: [facility];  Service: Cardiovascular;  Laterality: N/A;  . Appendectomy  1992   . Electrophysiologic study N/A 12/20/2015    Procedure: PVC Ablation;  Surgeon: Chanika Byland Meredith Leeds, MD;  Location: [facility];  Service: Cardiovascular;  Laterality: N/A;     Current Outpatient Prescriptions  Medication Sig Dispense Refill  . cholecalciferol (VITAMIN D) 1000 units tablet Take 1,000 Units by mouth daily.    . flecainide (TAMBOCOR) 50 MG tablet Take 1 tablet (50 mg total) by mouth every 12 (twelve) hours. 60 tablet 11  . metoprolol tartrate (LOPRESSOR) 25 MG tablet Take 0.5 tablets (12.5 mg total) by mouth 2 (two) times daily. 60 tablet 5  . thiamine (VITAMIN B-1) 100 MG tablet Take 100 mg by mouth daily.    . vitamin C (ASCORBIC ACID) 500 MG tablet Take 500 mg by mouth daily.    . vitamin E 100 UNIT capsule Take 100 Units by mouth daily.     No current facility-administered medications for this visit.    Allergies:   Review of patient's allergies indicates no known allergies.   Social History:  The patient  reports that she has never smoked. She has never used smokeless tobacco. She reports that she drinks alcohol. She reports that she does not use illicit drugs.   Family History:  The patient's family history is not on file.    ROS:  Please see the history of present illness.   Otherwise, review of systems is positive for chest pain.   All other systems are reviewed and negative.    PHYSICAL EXAM: VS:  BP  92/54 mmHg  Pulse 62  Ht 5' 3.5" (1.613 m)  Wt 118 lb (53.524 kg)  BMI 20.57 kg/m2 , BMI Body mass index is 20.57 kg/(m^2). GEN: Well nourished, well developed, in no acute distress HEENT: normal Neck: no JVD, carotid bruits, or masses Cardiac: RRR; no murmurs, rubs, or gallops,no edema  Respiratory:  clear to auscultation bilaterally, normal work of breathing GI: soft, nontender, nondistended, + BS MS: no deformity or atrophy Skin: warm and dry Neuro:  Strength and sensation are intact Psych: euthymic mood, full affect  EKG:  EKG is ordered  today. The ekg ordered today shows sinus rhythm, rate 62  Recent Labs: 11/28/2015: ALT 22; B Natriuretic Peptide 63.9; Magnesium 2.0; TSH 4.759* 12/20/2015: BUN 11; Creatinine, Ser 0.79; Hemoglobin 13.2; Platelets 192; Potassium 4.0; Sodium 146*    Lipid Panel     Component Value Date/Time   CHOL 136 11/29/2015 0249   TRIG 49 11/29/2015 0249   HDL 73 11/29/2015 0249   CHOLHDL 1.9 11/29/2015 0249   VLDL 10 11/29/2015 0249   LDLCALC 53 11/29/2015 0249     Wt Readings from Last 3 Encounters:  01/22/16 118 lb (53.524 kg)  12/20/15 115 lb (52.164 kg)  12/01/15 117 lb 4.6 oz (53.2 kg)      Other studies Reviewed: Additional studies/ records that were reviewed today include: TTE 11/30/15 Review of the above records today demonstrates:  Left ventricle: The cavity size was normal. Wall thickness was  normal. Systolic function was normal. The estimated ejection  fraction was in the range of 60% to 65%. Wall motion was normal;  there were no regional wall motion abnormalities. - Aortic valve: There was mild regurgitation. - Mitral valve: There was mild regurgitation.  LHC 11/30/15 1. There is hyperdynamic left ventricular systolic function. 2. Angiographically normal coronary arteries with a right dominant system   ASSESSMENT AND PLAN:  1.  PVCs: high burden without effect on EF.  Has been on flecainide with good results and significant decrease in PVC burden.  Had attempt at [facility] ablation with success with clinical PVC but with multiple other PVC morphologies seen.   2. Chest pain:  Likely noncardiac, but with her recent PVC ablation, we'll get a limited echocardiogram to see if there is any complication.   Current medicines are reviewed at length with the patient today.   The patient does not have concerns regarding her medicines.  The following changes were made today:  none  Labs/ tests ordered today include:  No orders of the defined types were placed in this encounter.      Disposition:   FU with Eliodoro Gullett 6 months  Signed, Gizell Danser Meredith Leeds, MD  01/22/2016 11:52 AM     [facility] [address] [place] [address] [phone] (office) [phone] (fax)

## 2016-01-22 ENCOUNTER — Ambulatory Visit (INDEPENDENT_AMBULATORY_CARE_PROVIDER_SITE_OTHER): Payer: 59 | Admitting: Cardiology

## 2016-01-22 ENCOUNTER — Encounter: Payer: Self-pay | Admitting: Cardiology

## 2016-01-22 VITALS — BP 92/54 | HR 62 | Ht 63.5 in | Wt 118.0 lb

## 2016-01-22 DIAGNOSIS — I493 Ventricular premature depolarization: Secondary | ICD-10-CM

## 2016-01-22 NOTE — Patient Instructions (Addendum)
Medication Instructions:  Your physician recommends that you continue on your current medications as directed. Please refer to the Current Medication list given to you today.  Labwork: None ordered  Testing/Procedures: Your physician has requested that you have an echocardiogram. Echocardiography is a painless test that uses sound waves to create images of your heart. It provides your doctor with information about the size and shape of your heart and how well your heart's chambers and valves are working. This procedure takes approximately one hour. There are no restrictions for this procedure.  Follow-Up: Your physician wants you to follow-up in: 6 months with Dr. Curt Bears. You will receive a reminder letter in the mail two months in advance. If you don't receive a letter, please call our office to schedule the follow-up appointment.  Any Other Special Instructions Will Be Listed Below (If Applicable).  If you need a refill on your cardiac medications before your next appointment, please call your pharmacy.  Thank you for choosing [facility]!!   Trinidad Curet, RN [phone]

## 2016-01-28 ENCOUNTER — Telehealth: Payer: Self-pay | Admitting: Cardiology

## 2016-01-28 NOTE — Telephone Encounter (Signed)
Patient s/p PVC ablation 2/23. Calls in today c/o soreness.  States "heart feels sore" and understands she is healing but says soreness is increasing, not decreasing. Explains that it is "sore all the time now" and "pain scale is a 1 to 2. Calling in because this is a change and was told to report if pain changed. Reports incident Saturday morning that "felt like sharp knife pains in my heart". She also reports that her left arm is slightly swollen and noticed her ring tight on that hand.  She doesn't know that this is related but wanted Dr. Curt Bears to know. Echo scheduled for 4/12. Patient aware I will forward to Dr. Curt Bears for his review/recommendations.   She understands that it will be tomorrow before calling her back as Dr. Curt Bears is out of the office today.

## 2016-01-28 NOTE — Telephone Encounter (Signed)
New message      Talk to the nurse about a change in her "condition".  Please call before 11:30 or after 12:30

## 2016-01-29 MED ORDER — COLCHICINE 0.6 MG PO TABS: 0.6000 mg | ORAL_TABLET | Freq: Every day | ORAL | Status: DC

## 2016-01-29 NOTE — Telephone Encounter (Signed)
Likely continued inflammation.  Start 0.6 mg colchicine daily for pain control.Marland Kitchen

## 2016-01-29 NOTE — Telephone Encounter (Signed)
Patient agreeable to starting medication.   She is aware to give it couple of days before calling to report if no improvement in pain/discomfort. Advised to contact office if pain changes/worsens.  She will keep monitoring and call if SOB develops. Patient verbalized understanding and agreeable to plan.

## 2016-02-06 ENCOUNTER — Other Ambulatory Visit: Payer: Self-pay | Admitting: Cardiology

## 2016-02-06 ENCOUNTER — Ambulatory Visit (HOSPITAL_BASED_OUTPATIENT_CLINIC_OR_DEPARTMENT_OTHER)
Admission: RE | Admit: 2016-02-06 | Discharge: 2016-02-06 | Disposition: A | Discharge status: Home / Self Care | Payer: 59 | Source: Ambulatory Visit | Attending: Cardiology | Admitting: Cardiology

## 2016-02-06 DIAGNOSIS — I313 Pericardial effusion (noninflammatory): Secondary | ICD-10-CM | POA: Diagnosis not present

## 2016-02-06 DIAGNOSIS — I493 Ventricular premature depolarization: Secondary | ICD-10-CM

## 2016-02-06 DIAGNOSIS — I340 Nonrheumatic mitral (valve) insufficiency: Secondary | ICD-10-CM | POA: Diagnosis not present

## 2016-02-06 DIAGNOSIS — I351 Nonrheumatic aortic (valve) insufficiency: Secondary | ICD-10-CM | POA: Diagnosis not present

## 2016-02-06 NOTE — Progress Notes (Signed)
  Echocardiogram 2D Echocardiogram Limited has been performed.  Donna Dickson M 02/06/2016, 10:49 AM

## 2016-03-17 ENCOUNTER — Ambulatory Visit: Payer: 59 | Admitting: Cardiology

## 2016-08-05 ENCOUNTER — Other Ambulatory Visit: Payer: Self-pay | Admitting: Cardiology

## 2016-08-05 ENCOUNTER — Telehealth: Payer: Self-pay | Admitting: Cardiology

## 2016-08-05 MED ORDER — METOPROLOL TARTRATE 25 MG PO TABS: 12.5000 mg | ORAL_TABLET | Freq: Two times a day (BID) | ORAL | 5 refills | Status: DC

## 2016-08-05 NOTE — Telephone Encounter (Signed)
New Message  Pt voiced wanting to know what anti biotics she needs to take due to restrictions.  Pt voiced had to have emergency extraction yesterday.  Please f/u

## 2016-08-05 NOTE — Telephone Encounter (Addendum)
Patient had a root canal yesterday and dentist would like to put her on abx but reluctant with patient on Flecainide.  Dentist is asking what abx would be ok to prescribe.  Discussed with pharmacist, Erasmo Downer -- advised patient ok to take Amoxicillin while taking Flecainide.

## 2016-08-06 ENCOUNTER — Other Ambulatory Visit: Payer: Self-pay

## 2016-10-16 ENCOUNTER — Other Ambulatory Visit: Payer: Self-pay

## 2016-10-16 MED ORDER — FLECAINIDE ACETATE 50 MG PO TABS: 50.0000 mg | ORAL_TABLET | Freq: Two times a day (BID) | ORAL | 2 refills | Status: DC

## 2016-10-22 ENCOUNTER — Other Ambulatory Visit: Payer: Self-pay

## 2016-10-22 MED ORDER — FLECAINIDE ACETATE 50 MG PO TABS: 50.0000 mg | ORAL_TABLET | Freq: Two times a day (BID) | ORAL | 2 refills | Status: DC

## 2016-11-14 ENCOUNTER — Other Ambulatory Visit: Payer: Self-pay

## 2016-11-14 ENCOUNTER — Other Ambulatory Visit: Payer: Self-pay | Admitting: *Deleted

## 2016-11-14 MED ORDER — METOPROLOL TARTRATE 25 MG PO TABS: 12.5000 mg | ORAL_TABLET | Freq: Two times a day (BID) | ORAL | 5 refills | Status: DC

## 2016-11-14 NOTE — Telephone Encounter (Signed)
error 

## 2016-11-28 ENCOUNTER — Telehealth: Payer: Self-pay | Admitting: Cardiology

## 2016-11-28 NOTE — Telephone Encounter (Signed)
New Message    We set a 1 year follow up appt for 01/21/17 if she does not need this appointment please let her know.  She said she was speaking with you earlier and got disconnected, thank you have great day

## 2017-01-20 NOTE — Progress Notes (Signed)
Electrophysiology Office Note   Date:  01/21/2017   ID:  Donna Dickson, DOB Nov 16, 1950, MRN [id number]  PCP:  Houston Siren, MD  Primary Electrophysiologist:  Constance Haw, MD    Chief Complaint  Patient presents with  . Follow-up    PVC's     History of Present Illness: Donna Dickson is a 66 y.o. female who presents today for electrophysiology evaluation.    She presented to the hospital with PVCs there were 50% or more of her beats. She had a cardiac cath which showed normal coronary arteries and an echocardiogram which was also normal. She was put on flecainide which decreased significantly the number of her PVCs. She was discharged and had an attempt at ablation of her PVCs, but unfortunately she had multiple morphologies at the time of ablation. She continues to be on flecainide with a normal EKG today. She says that she feels well.   Today, she denies symptoms of palpitations, shortness of breath, orthopnea, PND, lower extremity edema, claudication, dizziness, presyncope, syncope, bleeding, or neurologic sequela. The patient is tolerating medications without difficulties and is otherwise without complaint today.    Past Medical History:  Diagnosis Date  . Cancer of appendix ([place])   . PVC (premature ventricular contraction) dx'd 11/2015   Past Surgical History:  Procedure Laterality Date  . APPENDECTOMY  1992  . CARDIAC CATHETERIZATION N/A 11/30/2015   Procedure: Left Heart Cath and Coronary Angiography;  Surgeon: Leonie Man, MD;  Location: [place];  Service: Cardiovascular;  Laterality: N/A;  . COLON SURGERY  1992  . ELECTROPHYSIOLOGIC STUDY N/A 12/20/2015   Procedure: PVC Ablation;  Surgeon: Britanee Vanblarcom Meredith Leeds, MD;  Location: [facility];  Service: Cardiovascular;  Laterality: N/A;  . TONSILLECTOMY       Current Outpatient Prescriptions  Medication Sig Dispense Refill  . cholecalciferol (VITAMIN D) 1000 units tablet Take 1,000 Units by  mouth daily.    . flecainide (TAMBOCOR) 50 MG tablet Take 1 tablet (50 mg total) by mouth every 12 (twelve) hours. 60 tablet 2  . metoprolol tartrate (LOPRESSOR) 25 MG tablet Take 0.5 tablets (12.5 mg total) by mouth 2 (two) times daily. 60 tablet 5  . thiamine (VITAMIN B-1) 100 MG tablet Take 100 mg by mouth daily.    . vitamin C (ASCORBIC ACID) 500 MG tablet Take 500 mg by mouth daily.    . vitamin E 100 UNIT capsule Take 100 Units by mouth daily.     No current facility-administered medications for this visit.     Allergies:   Patient has no known allergies.   Social History:  The patient  reports that she has never smoked. She has never used smokeless tobacco. She reports that she drinks alcohol. She reports that she does not use drugs.   Family History:  The patient's family history includes Arrhythmia in her brother; Cancer - Colon in her father; Heart attack in her brother and paternal grandfather; Heart disease in her brother; Other in her brother and brother.    ROS:  Please see the history of present illness.   Otherwise, review of systems is positive for none.   All other systems are reviewed and negative.    PHYSICAL EXAM: VS:  There were no vitals taken for this visit. , BMI There is no height or weight on file to calculate BMI. GEN: Well nourished, well developed, in no acute distress  HEENT: normal  Neck: no JVD, carotid bruits, or  masses Cardiac: RRR; no murmurs, rubs, or gallops,no edema  Respiratory:  clear to auscultation bilaterally, normal work of breathing GI: soft, nontender, nondistended, + BS MS: no deformity or atrophy  Skin: warm and dry Neuro:  Strength and sensation are intact Psych: euthymic mood, full affect  EKG:  EKG is ordered today. The ekg ordered today shows sinus rhythm, rate 57, right axis eviation  Recent Labs: No results found for requested labs within last 8760 hours.    Lipid Panel     Component Value Date/Time   CHOL 136  11/29/2015 0249   TRIG 49 11/29/2015 0249   HDL 73 11/29/2015 0249   CHOLHDL 1.9 11/29/2015 0249   VLDL 10 11/29/2015 0249   LDLCALC 53 11/29/2015 0249     Wt Readings from Last 3 Encounters:  01/22/16 118 lb (53.5 kg)  12/20/15 115 lb (52.2 kg)  12/01/15 117 lb 4.6 oz (53.2 kg)      Other studies Reviewed: Additional studies/ records that were reviewed today include: TTE 11/30/15 Review of the above records today demonstrates:  Left ventricle: The cavity size was normal. Wall thickness was  normal. Systolic function was normal. The estimated ejection  fraction was in the range of 60% to 65%. Wall motion was normal;  there were no regional wall motion abnormalities. - Aortic valve: There was mild regurgitation. - Mitral valve: There was mild regurgitation.  LHC 11/30/15 1. There is hyperdynamic left ventricular systolic function. 2. Angiographically normal coronary arteries with a right dominant system  02/06/16 TTE - Left ventricle: The cavity size was normal. Wall thickness was   normal. Systolic function was normal. The estimated ejection   fraction was in the range of 60% to 65%. Wall motion was normal;   there were no regional wall motion abnormalities. - Aortic valve: There was mild regurgitation. - Mitral valve: There was mild regurgitation. - Pericardium, extracardiac: A trivial pericardial effusion was   identified.  ASSESSMENT AND PLAN:  1.  PVCs: high burden without effect on EF.  Has been on flecainide with good results and significant decrease in PVC burden.  Had attempt at [facility] ablation with success with clinical PVC but with multiple other PVC morphologies seen.No further symptoms of PVCs.    2. Chest pain:  Recent echo without major complication. We'll continue current management. Chest pain has resolved.   Current medicines are reviewed at length with the patient today.   The patient does not have concerns regarding her medicines.  The following changes  were made today:  none  Labs/ tests ordered today include:  No orders of the defined types were placed in this encounter.    Disposition:   FU with Tyria Springer 12 months  Signed, Camillo Quadros Meredith Leeds, MD  01/21/2017 9:43 AM     [facility] [address] [place] [address] [phone] (office) [phone] (fax)

## 2017-01-21 ENCOUNTER — Encounter (INDEPENDENT_AMBULATORY_CARE_PROVIDER_SITE_OTHER): Payer: Self-pay

## 2017-01-21 ENCOUNTER — Encounter: Payer: Self-pay | Admitting: Cardiology

## 2017-01-21 ENCOUNTER — Ambulatory Visit (INDEPENDENT_AMBULATORY_CARE_PROVIDER_SITE_OTHER): Payer: 59 | Admitting: Cardiology

## 2017-01-21 VITALS — BP 102/68 | HR 80 | Ht 63.5 in | Wt 122.6 lb

## 2017-01-21 DIAGNOSIS — I493 Ventricular premature depolarization: Secondary | ICD-10-CM

## 2017-01-21 NOTE — Patient Instructions (Addendum)
Medication Instructions:  Your physician recommends that you continue on your current medications as directed. Please refer to the Current Medication list given to you today.  * If you need a refill on your cardiac medications before your next appointment, please call your pharmacy.   Labwork: None ordered  Testing/Procedures: None ordered  Follow-Up: Your physician wants you to follow-up in: 1 year with Dr. Camnitz.  You will receive a reminder letter in the mail two months in advance. If you don't receive a letter, please call our office to schedule the follow-up appointment.  Thank you for choosing [facility]!!   Jennetta Flood, RN [phone]        

## 2017-03-10 DIAGNOSIS — R1013 Epigastric pain: Secondary | ICD-10-CM | POA: Insufficient documentation

## 2017-04-06 ENCOUNTER — Other Ambulatory Visit: Payer: Self-pay | Admitting: Cardiology

## 2017-08-24 DIAGNOSIS — N390 Urinary tract infection, site not specified: Secondary | ICD-10-CM | POA: Insufficient documentation

## 2017-08-25 ENCOUNTER — Telehealth (HOSPITAL_COMMUNITY): Payer: Self-pay | Admitting: Cardiology

## 2017-08-25 NOTE — Telephone Encounter (Signed)
Returned page to patient. She was recently prescribed Cipro and worried about interaction with flecainide. I spoke with [name], PharmD. Advised that Cipro wasn't the best choice with risk of QT prolongation. I asked that she call her PCP and ask they prescribe another antibiotic.   Donna Dickson 6:33 PM

## 2017-09-03 DIAGNOSIS — Z0000 Encounter for general adult medical examination without abnormal findings: Secondary | ICD-10-CM | POA: Insufficient documentation

## 2017-10-08 ENCOUNTER — Telehealth: Payer: Self-pay | Admitting: Cardiology

## 2017-10-08 NOTE — Telephone Encounter (Signed)
New Message    Pt c/o medication issue:  1. Name of Medication: cefdinir 300mg  2x a day and doxycline hiclate 100mg  2x a day 2. How are you currently taking this medication (dosage and times per day)?   3. Are you having a reaction (difficulty breathing--STAT)? no  4. What is your medication issue? Pt has pneumonia and pcp prescribe the above 2 medication. The pt wants to know are these okay to take with heart medication. Please call

## 2017-10-08 NOTE — Telephone Encounter (Signed)
Advised ok to take both abx with her current medications. Husband verbalized understanding and agreeable to plan.

## 2017-12-14 ENCOUNTER — Other Ambulatory Visit: Payer: Self-pay | Admitting: Cardiology

## 2017-12-30 ENCOUNTER — Telehealth: Payer: Self-pay | Admitting: Cardiology

## 2017-12-30 NOTE — Telephone Encounter (Signed)
Reviewed w/ Dr. Curt Bears. Advised to continue to monitor and call the office if the way she is feeling now changes in nature, CP begins, and/or other symptoms begin.  Otherwise, keep appt later this month to address. Patient verbalized understanding and agreeable to plan.

## 2017-12-30 NOTE — Telephone Encounter (Signed)
Occurring everyday for the past 2 weeks. Pt states it is "almost like my heart is being restrained, but not restrained all the way.  Like it is trying to do something but its not being let to do it". Denies CP, SOB, dizzy/light-headedness, syncope, skipped/irregular heart beats.  She is aware I will discuss w/ Dr. Curt Bears and call her back this afternoon w/ advisement.

## 2017-12-30 NOTE — Telephone Encounter (Signed)
Donna Dickson is calling because her heart feels like its being restrained . Which has been going on for about a week or so . Please call at [phone]. Thanks

## 2018-01-17 ENCOUNTER — Other Ambulatory Visit: Payer: Self-pay | Admitting: Cardiology

## 2018-01-22 ENCOUNTER — Encounter: Payer: Self-pay | Admitting: Cardiology

## 2018-01-22 ENCOUNTER — Ambulatory Visit: Payer: 59 | Admitting: Cardiology

## 2018-01-22 VITALS — BP 108/64 | HR 57 | Ht 63.5 in | Wt 116.4 lb

## 2018-01-22 DIAGNOSIS — R079 Chest pain, unspecified: Secondary | ICD-10-CM | POA: Diagnosis not present

## 2018-01-22 DIAGNOSIS — I493 Ventricular premature depolarization: Secondary | ICD-10-CM

## 2018-01-22 MED ORDER — FLECAINIDE ACETATE 100 MG PO TABS: 100.0000 mg | ORAL_TABLET | Freq: Two times a day (BID) | ORAL | 1 refills | Status: DC

## 2018-01-22 NOTE — Progress Notes (Signed)
Electrophysiology Office Note   Date:  01/22/2018   ID:  [name], DOB Aug 04, 1951, MRN [id number]  PCP:  Houston Siren., MD  Primary Electrophysiologist:  Constance Haw, MD    Chief Complaint  Patient presents with  . Follow-up    PVC's     History of Present Illness: Donna Dickson is a 67 y.o. female who presents today for electrophysiology evaluation.    She presented to the hospital with PVCs there were 50% or more of her beats. She had a cardiac cath which showed normal coronary arteries and an echocardiogram which was also normal. She was put on flecainide which decreased significantly the number of her PVCs. She was discharged and had an attempt at ablation of her PVCs, but unfortunately she had multiple morphologies at the time of ablation.   Today, denies symptoms of shortness of breath, orthopnea, PND, lower extremity edema, claudication, dizziness, presyncope, syncope, bleeding, or neurologic sequela. The patient is tolerating medications without difficulties.  Chest pain and palpitations have since returned.  Her pain is worse in the center of her chest.  There are no exacerbating or alleviating factors.  She had a 3-week time period where her chest pain was much worse.  She feels that these are possibly due to her PVCs.   Past Medical History:  Diagnosis Date  . Cancer of appendix ([place])   . PVC (premature ventricular contraction) dx'd 11/2015   Past Surgical History:  Procedure Laterality Date  . APPENDECTOMY  1992  . CARDIAC CATHETERIZATION N/A 11/30/2015   Procedure: Left Heart Cath and Coronary Angiography;  Surgeon: Leonie Man, MD;  Location: [facility];  Service: Cardiovascular;  Laterality: N/A;  . COLON SURGERY  1992  . ELECTROPHYSIOLOGIC STUDY N/A 12/20/2015   Procedure: PVC Ablation;  Surgeon: Ragen Laver Meredith Leeds, MD;  Location: [facility];  Service: Cardiovascular;  Laterality: N/A;  . TONSILLECTOMY       Current Outpatient  Medications  Medication Sig Dispense Refill  . cholecalciferol (VITAMIN D) 1000 units tablet Take 1,000 Units by mouth daily.    . flecainide (TAMBOCOR) 50 MG tablet TAKE 1 TABLET (50 MG TOTAL) BY MOUTH EVERY 12 (TWELVE) HOURS. 60 tablet 1  . metoprolol tartrate (LOPRESSOR) 25 MG tablet Take 0.5 tablets (12.5 mg total) by mouth 2 (two) times daily. Please keep upcoming appt for future refills. Thank you 30 tablet 0  . thiamine (VITAMIN B-1) 100 MG tablet Take 100 mg by mouth daily.    . vitamin C (ASCORBIC ACID) 500 MG tablet Take 500 mg by mouth daily.    . vitamin E 100 UNIT capsule Take 100 Units by mouth daily.     No current facility-administered medications for this visit.     Allergies:   Patient has no known allergies.   Social History:  The patient  reports that she has never smoked. She has never used smokeless tobacco. She reports that she drinks alcohol. She reports that she does not use drugs.   Family History:  The patient's family history includes Arrhythmia in her brother; Cancer - Colon in her father; Heart attack in her brother and paternal grandfather; Heart disease in her brother; Other in her brother and brother.   ROS:  Please see the history of present illness.   Otherwise, review of systems is positive for chest pain, snoring.   All other systems are reviewed and negative.   PHYSICAL EXAM: VS:  There were no vitals  taken for this visit. , BMI There is no height or weight on file to calculate BMI. GEN: Well nourished, well developed, in no acute distress  HEENT: normal  Neck: no JVD, carotid bruits, or masses Cardiac: RRR; no murmurs, rubs, or gallops,no edema  Respiratory:  clear to auscultation bilaterally, normal work of breathing GI: soft, nontender, nondistended, + BS MS: no deformity or atrophy  Skin: warm and dry Neuro:  Strength and sensation are intact Psych: euthymic mood, full affect  EKG:  EKG is ordered today. Personal review of the ekg ordered  shows sinus rhythm, PVcs, rate 57   Recent Labs: No results found for requested labs within last 8760 hours.    Lipid Panel     Component Value Date/Time   CHOL 136 11/29/2015 0249   TRIG 49 11/29/2015 0249   HDL 73 11/29/2015 0249   CHOLHDL 1.9 11/29/2015 0249   VLDL 10 11/29/2015 0249   LDLCALC 53 11/29/2015 0249     Wt Readings from Last 3 Encounters:  01/21/17 122 lb 9.6 oz (55.6 kg)  01/22/16 118 lb (53.5 kg)  12/20/15 115 lb (52.2 kg)      Other studies Reviewed: Additional studies/ records that were reviewed today include: TTE 11/30/15 Review of the above records today demonstrates:  Left ventricle: The cavity size was normal. Wall thickness was  normal. Systolic function was normal. The estimated ejection  fraction was in the range of 60% to 65%. Wall motion was normal;  there were no regional wall motion abnormalities. - Aortic valve: There was mild regurgitation. - Mitral valve: There was mild regurgitation.  LHC 11/30/15 1. There is hyperdynamic left ventricular systolic function. 2. Angiographically normal coronary arteries with a right dominant system  02/06/16 TTE - Left ventricle: The cavity size was normal. Wall thickness was   normal. Systolic function was normal. The estimated ejection   fraction was in the range of 60% to 65%. Wall motion was normal;   there were no regional wall motion abnormalities. - Aortic valve: There was mild regurgitation. - Mitral valve: There was mild regurgitation. - Pericardium, extracardiac: A trivial pericardial effusion was   identified.  ASSESSMENT AND PLAN:  1.  PVCs: Had a high burden of PVCs without effect on her ejection fraction.  Had attempted ablation but had multiple PVCs noted.  She was put on flecainide with an improvement in her PVC burden.  She is now having more chest pain which could possibly be due to PVCs.  Uliana Brinker increase her flecainide to 100 mg twice a day.  Should she continue to have symptoms we  Broghan Pannone place a Holter monitor.    2.  Aortic atherosclerosis: Found on CT scan incidentally.  No evidence of aneurysm.  We Sherian Valenza continue to follow.   Current medicines are reviewed at length with the patient today.   The patient does not have concerns regarding her medicines.  The following changes were made today:  none  Labs/ tests ordered today include:  No orders of the defined types were placed in this encounter.    Disposition:   FU with Sylvanus Telford 6 months  Signed, Keylani Perlstein Meredith Leeds, MD  01/22/2018 9:37 AM     [facility] [address] [place] [address] [phone] (office) [phone] (fax)

## 2018-01-22 NOTE — Patient Instructions (Addendum)
Medication Instructions:  Your physician has recommended you make the following change in your medication:  1. INCREASE Flecainide to 100 mg twice daily  Labwork: None ordered  Testing/Procedures: None ordered  Follow-Up: Your physician recommends that you schedule a follow-up appointment in: 1-2 weeks for nurse visit EKG.  Your physician wants you to follow-up in: 6 months with Dr. Curt Bears.  You will receive a reminder letter in the mail two months in advance. If you don't receive a letter, please call our office to schedule the follow-up appointment.  * If you need a refill on your cardiac medications before your next appointment, please call your pharmacy.   *Please note that any paperwork needing to be filled out by the provider will need to be addressed at the front desk prior to seeing the provider. Please note that any FMLA, disability or other documents regarding health condition is subject to a $25.00 charge that must be received prior to completion of paperwork in the form of a money order or check.  Thank you for choosing [facility]!!   Trinidad Curet, RN [phone]

## 2018-02-03 ENCOUNTER — Ambulatory Visit (INDEPENDENT_AMBULATORY_CARE_PROVIDER_SITE_OTHER): Payer: 59

## 2018-02-03 VITALS — BP 100/50 | Ht 63.0 in | Wt 122.0 lb

## 2018-02-03 DIAGNOSIS — I493 Ventricular premature depolarization: Secondary | ICD-10-CM

## 2018-02-03 NOTE — Progress Notes (Signed)
1.) Reason for visit: Checking EKG for pvcs with Flecainide increase to 100 mg by mouth twice per day  2.) Name of MD requesting visit: Dr. Curt Bears  3.) H&P: Hx of pvcs and intermittent CP  4.) ROS related to problem: Patient here for Flecainide dose adjustment.. Checking rhythm for pvcs.Marland Kitchen HR-60 bpm BP 100/50  5.) Assessment and plan per MD: Per Dr Rayann Heman, Flecainide 100 mg bid recommended with no evidence of pvcs

## 2018-02-03 NOTE — Patient Instructions (Signed)
Medication Instructions:  Your physician recommends that you continue on your current medications as directed. Please refer to the Current Medication list given to you today.  -- If you need a refill on your cardiac medications before your next appointment, please call your pharmacy. --  Labwork: None ordered  Testing/Procedures: None ordered  Follow-Up: Your physician wants you to follow-up as planned    Thank you for choosing [facility]!!    Any Other Special Instructions Will Be Listed Below (If Applicable).

## 2018-02-08 ENCOUNTER — Other Ambulatory Visit: Payer: Self-pay | Admitting: Cardiology

## 2018-02-15 ENCOUNTER — Other Ambulatory Visit: Payer: Self-pay | Admitting: Cardiology

## 2018-07-18 ENCOUNTER — Other Ambulatory Visit: Payer: Self-pay | Admitting: Cardiology

## 2018-07-21 ENCOUNTER — Ambulatory Visit (INDEPENDENT_AMBULATORY_CARE_PROVIDER_SITE_OTHER): Payer: Medicare Other | Admitting: Cardiology

## 2018-07-21 ENCOUNTER — Encounter: Payer: Self-pay | Admitting: Cardiology

## 2018-07-21 VITALS — BP 106/54 | HR 51 | Ht 63.0 in | Wt 113.0 lb

## 2018-07-21 DIAGNOSIS — I493 Ventricular premature depolarization: Secondary | ICD-10-CM

## 2018-07-21 MED ORDER — FLECAINIDE ACETATE 100 MG PO TABS: 100.0000 mg | ORAL_TABLET | Freq: Two times a day (BID) | ORAL | 2 refills | Status: DC

## 2018-07-21 MED ORDER — METOPROLOL SUCCINATE ER 25 MG PO TB24: 25.0000 mg | ORAL_TABLET | Freq: Every day | ORAL | 6 refills | Status: DC

## 2018-07-21 NOTE — Progress Notes (Signed)
Electrophysiology Office Note   Date:  07/21/2018   ID:  Donna Dickson, DOB 12-Oct-1951, MRN [id number]  PCP:  Houston Siren., MD  Primary Electrophysiologist:  Constance Haw, MD    No chief complaint on file.    History of Present Illness: Donna Dickson is a 67 y.o. female who presents today for electrophysiology evaluation.    She presented to the hospital with PVCs there were 50% or more of her beats. She had a cardiac cath which showed normal coronary arteries and an echocardiogram which was also normal. She was put on flecainide which decreased significantly the number of her PVCs. She was discharged and had an attempt at ablation of her PVCs, but unfortunately she had multiple morphologies at the time of ablation.   Today, denies symptoms of palpitations, chest pain, shortness of breath, orthopnea, PND, lower extremity edema, claudication, dizziness, presyncope, syncope, bleeding, or neurologic sequela. The patient is tolerating medications without difficulties.  Overall she is feeling well.  She is noted no further episodes of PVCs.  Unfortunately she does get dizzy at times.  Her heart rate today is in the low 50s.   Past Medical History:  Diagnosis Date  . Cancer of appendix ([place])   . PVC (premature ventricular contraction) dx'd 11/2015   Past Surgical History:  Procedure Laterality Date  . APPENDECTOMY  1992  . CARDIAC CATHETERIZATION N/A 11/30/2015   Procedure: Left Heart Cath and Coronary Angiography;  Surgeon: Leonie Man, MD;  Location: [facility];  Service: Cardiovascular;  Laterality: N/A;  . COLON SURGERY  1992  . ELECTROPHYSIOLOGIC STUDY N/A 12/20/2015   Procedure: PVC Ablation;  Surgeon: Skylier Kretschmer Meredith Leeds, MD;  Location: [facility];  Service: Cardiovascular;  Laterality: N/A;  . TONSILLECTOMY       Current Outpatient Medications  Medication Sig Dispense Refill  . cholecalciferol (VITAMIN D) 1000 units tablet Take 1,000 Units by mouth  daily.    . flecainide (TAMBOCOR) 100 MG tablet TAKE 1 TABLET BY MOUTH TWICE A DAY 180 tablet 1  . metoprolol tartrate (LOPRESSOR) 25 MG tablet TAKE 0.5 TABLETS (12.5 MG TOTAL) BY MOUTH 2 (TWO) TIMES DAILY. 30 tablet 12  . thiamine (VITAMIN B-1) 100 MG tablet Take 100 mg by mouth daily.    . vitamin C (ASCORBIC ACID) 500 MG tablet Take 500 mg by mouth daily.    . vitamin E 100 UNIT capsule Take 100 Units by mouth daily.     No current facility-administered medications for this visit.     Allergies:   Patient has no known allergies.   Social History:  The patient  reports that she has never smoked. She has never used smokeless tobacco. She reports that she drinks alcohol. She reports that she does not use drugs.   Family History:  The patient's family history includes Arrhythmia in her brother; Cancer - Colon in her father; Heart attack in her brother and paternal grandfather; Heart disease in her brother; Other in her brother and brother.   ROS:  Please see the history of present illness.   Otherwise, review of systems is positive for palpitations.   All other systems are reviewed and negative.   PHYSICAL EXAM: VS:  BP (!) 106/54   Pulse (!) 51   Ht 5\' 3"  (1.6 m)   Wt 113 lb (51.3 kg)   BMI 20.02 kg/m  , BMI Body mass index is 20.02 kg/m. GEN: Well nourished, well developed, in no acute  distress  HEENT: normal  Neck: no JVD, carotid bruits, or masses Cardiac: RRR; no murmurs, rubs, or gallops,no edema  Respiratory:  clear to auscultation bilaterally, normal work of breathing GI: soft, nontender, nondistended, + BS MS: no deformity or atrophy  Skin: warm and dry Neuro:  Strength and sensation are intact Psych: euthymic mood, full affect  EKG:  EKG is ordered today. Personal review of the ekg ordered shows sinus rhythm, right axis deviation, rate 51  Recent Labs: No results found for requested labs within last 8760 hours.    Lipid Panel     Component Value Date/Time    CHOL 136 11/29/2015 0249   TRIG 49 11/29/2015 0249   HDL 73 11/29/2015 0249   CHOLHDL 1.9 11/29/2015 0249   VLDL 10 11/29/2015 0249   LDLCALC 53 11/29/2015 0249     Wt Readings from Last 3 Encounters:  07/21/18 113 lb (51.3 kg)  02/03/18 122 lb (55.3 kg)  01/22/18 116 lb 6.4 oz (52.8 kg)      Other studies Reviewed: Additional studies/ records that were reviewed today include: TTE 11/30/15 Review of the above records today demonstrates:  Left ventricle: The cavity size was normal. Wall thickness was  normal. Systolic function was normal. The estimated ejection  fraction was in the range of 60% to 65%. Wall motion was normal;  there were no regional wall motion abnormalities. - Aortic valve: There was mild regurgitation. - Mitral valve: There was mild regurgitation.  LHC 11/30/15 1. There is hyperdynamic left ventricular systolic function. 2. Angiographically normal coronary arteries with a right dominant system  02/06/16 TTE - Left ventricle: The cavity size was normal. Wall thickness was   normal. Systolic function was normal. The estimated ejection   fraction was in the range of 60% to 65%. Wall motion was normal;   there were no regional wall motion abnormalities. - Aortic valve: There was mild regurgitation. - Mitral valve: There was mild regurgitation. - Pericardium, extracardiac: A trivial pericardial effusion was   identified.  ASSESSMENT AND PLAN:  1.  PVCs: High burden without an effect on her ejection fraction but had quite a few symptoms.  Ablation was attempted but she had multiple PVC morphologies.  She is currently on 100 mg of flecainide and feeling well without symptoms.  She is having some fatigue and weakness as well as dizziness and her heart rate is slow.  We Roselynne Lortz stop her metoprolol twice a day and start her on Toprol-XL 25 mg to be taken at night.    2.  Aortic atherosclerosis: Found incidentally on CT scanning.  Continuing to follow.  Current  medicines are reviewed at length with the patient today.   The patient does not have concerns regarding her medicines.  The following changes were made today: Stop metoprolol, start Toprol-XL 25 mg  Labs/ tests ordered today include:  Orders Placed This Encounter  Procedures  . EKG 12-Lead     Disposition:   FU with Latishia Suitt 6 months  Signed, Needham Biggins Meredith Leeds, MD  07/21/2018 10:05 AM     [facility] [address] [place] [address] [phone] (office) [phone] (fax)

## 2018-07-21 NOTE — Patient Instructions (Addendum)
Medication Instructions:  Your physician has recommended you make the following change in your medication: 1. STOP Lopressor 2. START Toprol 25 mg once daily at bedtime  * If you need a refill on your cardiac medications before your next appointment, please call your pharmacy.   Labwork: None ordered  Testing/Procedures: None ordered  Follow-Up: Your physician wants you to follow-up in: 6 months with Dr. Curt Bears.  You will receive a reminder letter in the mail two months in advance. If you don't receive a letter, please call our office to schedule the follow-up appointment.  *Please note that any paperwork needing to be filled out by the provider will need to be addressed at the front desk prior to seeing the provider. Please note that any FMLA, disability or other documents regarding health condition is subject to a $25.00 charge that must be received prior to completion of paperwork in the form of a money order or check.  Thank you for choosing [facility]!!   Donna Curet, RN [phone]  Any Other Special Instructions Will Be Listed Below (If Applicable).   Metoprolol extended-release tablets What is this medicine? METOPROLOL (me TOE proe lole) is a beta-blocker. Beta-blockers reduce the workload on the heart and help it to beat more regularly. This medicine is used to treat high blood pressure and to prevent chest pain. It is also used to after a heart attack and to prevent an additional heart attack from occurring. This medicine may be used for other purposes; ask your health care provider or pharmacist if you have questions. COMMON BRAND NAME(S): toprol, Toprol XL What should I tell my health care provider before I take this medicine? They need to know if you have any of these conditions: -diabetes -heart or vessel disease like slow heart rate, worsening heart failure, heart block, sick sinus syndrome or Raynaud's disease -kidney disease -liver disease -lung or  breathing disease, like asthma or emphysema -pheochromocytoma -thyroid disease -an unusual or allergic reaction to metoprolol, other beta-blockers, medicines, foods, dyes, or preservatives -pregnant or trying to get pregnant -breast-feeding How should I use this medicine? Take this medicine by mouth with a glass of water. Follow the directions on the prescription label. Do not crush or chew. Take this medicine with or immediately after meals. Take your doses at regular intervals. Do not take more medicine than directed. Do not stop taking this medicine suddenly. This could lead to serious heart-related effects. Talk to your pediatrician regarding the use of this medicine in children. While this drug may be prescribed for children as young as 6 years for selected conditions, precautions do apply. Overdosage: If you think you have taken too much of this medicine contact a poison control center or emergency room at once. NOTE: This medicine is only for you. Do not share this medicine with others. What if I miss a dose? If you miss a dose, take it as soon as you can. If it is almost time for your next dose, take only that dose. Do not take double or extra doses. What may interact with this medicine? This medicine may interact with the following medications: -certain medicines for blood pressure, heart disease, irregular heart beat -certain medicines for depression, like monoamine oxidase (MAO) inhibitors, fluoxetine, or paroxetine -clonidine -dobutamine -epinephrine -isoproterenol -reserpine This list may not describe all possible interactions. Give your health care provider a list of all the medicines, herbs, non-prescription drugs, or dietary supplements you use. Also tell them if you smoke, drink alcohol,  or use illegal drugs. Some items may interact with your medicine. What should I watch for while using this medicine? Visit your doctor or health care professional for regular check ups.  Contact your doctor right away if your symptoms worsen. Check your blood pressure and pulse rate regularly. Ask your health care professional what your blood pressure and pulse rate should be, and when you should contact them. You may get drowsy or dizzy. Do not drive, use machinery, or do anything that needs mental alertness until you know how this medicine affects you. Do not sit or stand up quickly, especially if you are an older patient. This reduces the risk of dizzy or fainting spells. Contact your doctor if these symptoms continue. Alcohol may interfere with the effect of this medicine. Avoid alcoholic drinks. What side effects may I notice from receiving this medicine? Side effects that you should report to your doctor or health care professional as soon as possible: -allergic reactions like skin rash, itching or hives -cold or numb hands or feet -depression -difficulty breathing -faint -fever with sore throat -irregular heartbeat, chest pain -rapid weight gain -swollen legs or ankles Side effects that usually do not require medical attention (report to your doctor or health care professional if they continue or are bothersome): -anxiety or nervousness -change in sex drive or performance -dry skin -headache -nightmares or trouble sleeping -short term memory loss -stomach upset or diarrhea -unusually tired This list may not describe all possible side effects. Call your doctor for medical advice about side effects. You may report side effects to FDA at 1-800-FDA-1088. Where should I keep my medicine? Keep out of the reach of children. Store at room temperature between 15 and 30 degrees C (59 and 86 degrees F). Throw away any unused medicine after the expiration date. NOTE: This sheet is a summary. It may not cover all possible information. If you have questions about this medicine, talk to your doctor, pharmacist, or health care provider.  2018 Elsevier/Gold Standard (2013-06-17  14:41:37)

## 2018-07-21 NOTE — Addendum Note (Signed)
Addended by: Stanton Kidney on: 07/21/2018 10:23 AM   Modules accepted: Orders

## 2019-01-18 ENCOUNTER — Encounter: Payer: Self-pay | Admitting: *Deleted

## 2019-01-24 ENCOUNTER — Telehealth: Payer: Self-pay | Admitting: *Deleted

## 2019-01-24 MED ORDER — METOPROLOL SUCCINATE ER 25 MG PO TB24: 25.0000 mg | ORAL_TABLET | Freq: Every day | ORAL | 0 refills | Status: DC

## 2019-01-24 NOTE — Telephone Encounter (Signed)
Called patient to let them know due to recent [place] and Health Department Protocols, we are not seeing patients in the office. We are instead seeing if they would like to schedule this appointment as a Research scientist (medical) or Laptop. Patient states she is doing fine with no complaints.  She doesn't mind doing WebEx visits but also feels ok enough to push out visit if she has to.  Patient's only issue right now is needing a refill on Metoprolol. Rx sent to [facility] for 90 days

## 2019-01-25 NOTE — Telephone Encounter (Signed)
Discussed rationale with patient for webex visit.   Pt is agreeable to scheduling a webex visit  via her Iphone for visit. Pt aware office will contact her to schedule a visit.

## 2019-02-01 ENCOUNTER — Telehealth: Payer: Self-pay | Admitting: *Deleted

## 2019-02-01 NOTE — Telephone Encounter (Signed)
Patient is agreeable to Face Time with IOS.  It is easier for her vs WebEx.    Patient was advised to have pencil/pen and paper ready to take notes before, during and after the Virtual visit, to have BP, HR, and recent Wt, and any other concerns and questions ready prior to start of appointment.  Patient advised appointment times are set like a regular schedule and will begin possibly 10 minutes before set time. Appointment is not a floating schedule for that day.

## 2019-02-03 ENCOUNTER — Telehealth (INDEPENDENT_AMBULATORY_CARE_PROVIDER_SITE_OTHER): Payer: Medicare Other | Admitting: Cardiology

## 2019-02-03 ENCOUNTER — Other Ambulatory Visit: Payer: Self-pay

## 2019-02-03 DIAGNOSIS — R002 Palpitations: Secondary | ICD-10-CM

## 2019-02-03 MED ORDER — FLECAINIDE ACETATE 100 MG PO TABS: 100.0000 mg | ORAL_TABLET | Freq: Two times a day (BID) | ORAL | 2 refills | Status: DC

## 2019-02-03 MED ORDER — METOPROLOL SUCCINATE ER 25 MG PO TB24: 25.0000 mg | ORAL_TABLET | Freq: Every day | ORAL | 2 refills | Status: DC

## 2019-02-03 NOTE — Patient Instructions (Signed)
Medication Instructions:  Your physician recommends that you continue on your current medications as directed. Please refer to the Current Medication list given to you today.  If you need a refill on your cardiac medications before your next appointment, please call your pharmacy.   Labwork: None ordered  Testing/Procedures: None ordered  Follow-Up: Your physician wants you to follow-up in: 6 months with Dr. Camnitz.  You will receive a reminder letter in the mail two months in advance. If you don't receive a letter, please call our office to schedule the follow-up appointment.  Thank you for choosing [facility]!!   Meagan Spease, RN [phone]         

## 2019-02-03 NOTE — Progress Notes (Signed)
Electrophysiology TeleHealth Note   Due to national recommendations of social distancing due to COVID 19, an audio/video telehealth visit is felt to be most appropriate for this patient at this time.  See MyChart message from today for the patient's consent to telehealth for [facility].   Date:  02/03/2019   ID:  Donna Dickson, DOB [birth year], MRN [id number]  Location: patient's home  Provider location: [address] [place]  Evaluation Performed: Follow-up visit  PCP:  Houston Siren., MD  Cardiologist:  Ladale Sherburn Meredith Leeds, MD  Electrophysiologist:  Dr Curt Bears  Chief Complaint:  PVC  History of Present Illness:    Donna Dickson is a 68 y.o. female who presents via audio/video conferencing for a telehealth visit today.  Since last being seen in our clinic, the patient reports doing very well.  Today, she denies symptoms of palpitations, chest pain, shortness of breath,  lower extremity edema, dizziness, presyncope, or syncope.  The patient is otherwise without complaint today.  The patient denies symptoms of fevers, chills, cough, or new SOB worrisome for COVID 19.  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 She had an attempted ablation but had multiple morphologies.  Today, denies symptoms of palpitations, chest pain, shortness of breath, orthopnea, PND, lower extremity edema, claudication, dizziness, presyncope, syncope, bleeding, or neurologic sequela. The patient is tolerating medications without difficulties.  She has been having mild headaches that she feels could potentially be due to her flecainide.  Her headache started a few months ago.  Her headaches have gotten worse over the last few weeks due to allergies.  Past Medical History:  Diagnosis Date  . Cancer of appendix ([place])   . PVC (premature ventricular contraction) dx'd 11/2015    Past Surgical History:  Procedure Laterality Date  . APPENDECTOMY  1992  . CARDIAC CATHETERIZATION N/A  11/30/2015   Procedure: Left Heart Cath and Coronary Angiography;  Surgeon: Leonie Man, MD;  Location: [facility];  Service: Cardiovascular;  Laterality: N/A;  . COLON SURGERY  1992  . ELECTROPHYSIOLOGIC STUDY N/A 12/20/2015   Procedure: PVC Ablation;  Surgeon: Jocsan Mcginley Meredith Leeds, MD;  Location: [facility];  Service: Cardiovascular;  Laterality: N/A;  . TONSILLECTOMY      Current Outpatient Medications  Medication Sig Dispense Refill  . cholecalciferol (VITAMIN D) 1000 units tablet Take 1,000 Units by mouth daily.    . flecainide (TAMBOCOR) 100 MG tablet Take 1 tablet (100 mg total) by mouth 2 (two) times daily. 180 tablet 2  . metoprolol succinate (TOPROL-XL) 25 MG 24 hr tablet Take 1 tablet (25 mg total) by mouth daily. Take with or immediately following a meal. 90 tablet 0  . thiamine (VITAMIN B-1) 100 MG tablet Take 100 mg by mouth daily.    . vitamin C (ASCORBIC ACID) 500 MG tablet Take 500 mg by mouth daily.    . vitamin E 100 UNIT capsule Take 100 Units by mouth daily.     No current facility-administered medications for this visit.     Allergies:   Patient has no known allergies.   Social History:  The patient  reports that she has never smoked. She has never used smokeless tobacco. She reports current alcohol use. She reports that she does not use drugs.   Family History:  The patient's  family history includes Arrhythmia in her brother; Cardiomyopathy in her brother; Colon cancer in her father; Heart attack in her brother  and paternal grandfather; Heart disease in her brother and brother.   ROS:  Please see the history of present illness.   All other systems are personally reviewed and negative.    Exam:    Vital Signs:  There were no vitals taken for this visit.  Well appearing, alert and conversant, regular work of breathing,  good skin color Eyes- anicteric, neuro- grossly intact, skin- no apparent rash or lesions or cyanosis, mouth- oral mucosa is  pink   Labs/Other Tests and Data Reviewed:    Recent Labs: No results found for requested labs within last 8760 hours.   Wt Readings from Last 3 Encounters:  07/21/18 113 lb (51.3 kg)  02/03/18 122 lb (55.3 kg)  01/22/18 116 lb 6.4 oz (52.8 kg)     Other studies personally reviewed: Additional studies/ records that were reviewed today include: ECG 07/21/18  Review of the above records today demonstrates:  SR, rate 51   ASSESSMENT & PLAN:    1.  PVCs: on flecainide.  She is also on Toprol-XL which she is not having any issues as she is taking it at night.  She is no longer having dizziness.  She is having some headaches that she attributes to her flecainide.  We Yobany Vroom continue with current management as she did not wish to change her medication.    COVID 19 screen The patient denies symptoms of COVID 19 at this time.  The importance of social distancing was discussed today.  Follow-up: 6 months   Current medicines are reviewed at length with the patient today.   The patient does not have concerns regarding her medicines.  The following changes were made today:  none  Labs/ tests ordered today include:  No orders of the defined types were placed in this encounter.    Patient Risk:  after full review of this patients clinical status, I feel that they are at moderate risk at this time.  Today, I have spent 15 minutes with the patient with telehealth technology discussing PVC .    Signed, Fahd Galea Meredith Leeds, MD  02/03/2019 10:17 AM     [address] [place] [id number] [phone] (office) [phone] (fax)

## 2019-08-04 ENCOUNTER — Ambulatory Visit (INDEPENDENT_AMBULATORY_CARE_PROVIDER_SITE_OTHER): Payer: Medicare Other | Admitting: Cardiology

## 2019-08-04 ENCOUNTER — Encounter: Payer: Self-pay | Admitting: Cardiology

## 2019-08-04 ENCOUNTER — Encounter (INDEPENDENT_AMBULATORY_CARE_PROVIDER_SITE_OTHER): Payer: Self-pay

## 2019-08-04 ENCOUNTER — Other Ambulatory Visit: Payer: Self-pay

## 2019-08-04 VITALS — BP 110/62 | HR 57 | Ht 63.0 in | Wt 117.2 lb

## 2019-08-04 DIAGNOSIS — I493 Ventricular premature depolarization: Secondary | ICD-10-CM

## 2019-08-04 NOTE — Patient Instructions (Signed)
Medication Instructions:  Your physician recommends that you continue on your current medications as directed. Please refer to the Current Medication list given to you today.  * If you need a refill on your cardiac medications before your next appointment, please call your pharmacy.   Labwork: None ordered  Testing/Procedures: None ordered  Follow-Up: Your physician wants you to follow-up in: 1 year with Dr. Camnitz.  You will receive a reminder letter in the mail two months in advance. If you don't receive a letter, please call our office to schedule the follow-up appointment.  Thank you for choosing [facility]!!   Jessika Rothery, RN [phone]        

## 2019-08-04 NOTE — Progress Notes (Signed)
Electrophysiology Office Note   Date:  08/04/2019   ID:  [name], DOB [birth year], MRN [id number]  PCP:  Houston Siren., MD  Primary Electrophysiologist:  Constance Haw, MD    No chief complaint on file.    History of Present Illness: Donna Dickson is a 68 y.o. female who presents today for electrophysiology evaluation.    She presented to the hospital with PVCs there were 50% or more of her beats. She had a cardiac cath which showed normal coronary arteries and an echocardiogram which was also normal. She was put on flecainide which decreased significantly the number of her PVCs. She was discharged and had an attempt at ablation of her PVCs, but unfortunately she had multiple morphologies at the time of ablation.   Today, denies symptoms of palpitations, chest pain, shortness of breath, orthopnea, PND, lower extremity edema, claudication, dizziness, presyncope, syncope, bleeding, or neurologic sequela. The patient is tolerating medications without difficulties.  She continues to do well.  She notes no further episodes of PVCs.  She does have some mild fatigue, but otherwise has no major complaints.   Past Medical History:  Diagnosis Date  . Cancer of appendix ([place])   . PVC (premature ventricular contraction) dx'd 11/2015   Past Surgical History:  Procedure Laterality Date  . APPENDECTOMY  1992  . CARDIAC CATHETERIZATION N/A 11/30/2015   Procedure: Left Heart Cath and Coronary Angiography;  Surgeon: Leonie Man, MD;  Location: [facility];  Service: Cardiovascular;  Laterality: N/A;  . COLON SURGERY  1992  . ELECTROPHYSIOLOGIC STUDY N/A 12/20/2015   Procedure: PVC Ablation;  Surgeon: Jamayah Myszka Meredith Leeds, MD;  Location: [facility];  Service: Cardiovascular;  Laterality: N/A;  . TONSILLECTOMY       Current Outpatient Medications  Medication Sig Dispense Refill  . cholecalciferol (VITAMIN D) 1000 units tablet Take 1,000 Units by mouth daily.    .  flecainide (TAMBOCOR) 100 MG tablet Take 1 tablet (100 mg total) by mouth 2 (two) times daily. 180 tablet 2  . metoprolol succinate (TOPROL-XL) 25 MG 24 hr tablet Take 1 tablet (25 mg total) by mouth daily. Take with or immediately following a meal. 90 tablet 2  . thiamine (VITAMIN B-1) 100 MG tablet Take 100 mg by mouth daily.    . vitamin C (ASCORBIC ACID) 500 MG tablet Take 500 mg by mouth daily.    . vitamin E 100 UNIT capsule Take 100 Units by mouth daily.     No current facility-administered medications for this visit.     Allergies:   Patient has no known allergies.   Social History:  The patient  reports that she has never smoked. She has never used smokeless tobacco. She reports current alcohol use. She reports that she does not use drugs.   Family History:  The patient's family history includes Arrhythmia in her brother; Cardiomyopathy in her brother; Colon cancer in her father; Heart attack in her brother and paternal grandfather; Heart disease in her brother and brother.   ROS:  Please see the history of present illness.   Otherwise, review of systems is positive for none.   All other systems are reviewed and negative.   PHYSICAL EXAM: VS:  BP 110/62   Pulse (!) 57   Ht 5\' 3"  (1.6 m)   Wt 117 lb 3.2 oz (53.2 kg)   BMI 20.76 kg/m  , BMI Body mass index is 20.76 kg/m. GEN: Well nourished, well developed,  in no acute distress  HEENT: normal  Neck: no JVD, carotid bruits, or masses Cardiac: RRR; no murmurs, rubs, or gallops,no edema  Respiratory:  clear to auscultation bilaterally, normal work of breathing GI: soft, nontender, nondistended, + BS MS: no deformity or atrophy  Skin: warm and dry Neuro:  Strength and sensation are intact Psych: euthymic mood, full affect  EKG:  EKG is ordered today. Personal review of the ekg ordered shows sinus rhythm, rate 57  Recent Labs: No results found for requested labs within last 8760 hours.    Lipid Panel     Component Value  Date/Time   CHOL 136 11/29/2015 0249   TRIG 49 11/29/2015 0249   HDL 73 11/29/2015 0249   CHOLHDL 1.9 11/29/2015 0249   VLDL 10 11/29/2015 0249   LDLCALC 53 11/29/2015 0249     Wt Readings from Last 3 Encounters:  08/04/19 117 lb 3.2 oz (53.2 kg)  02/03/19 116 lb 3.2 oz (52.7 kg)  07/21/18 113 lb (51.3 kg)      Other studies Reviewed: Additional studies/ records that were reviewed today include: TTE 11/30/15 Review of the above records today demonstrates:  Left ventricle: The cavity size was normal. Wall thickness was  normal. Systolic function was normal. The estimated ejection  fraction was in the range of 60% to 65%. Wall motion was normal;  there were no regional wall motion abnormalities. - Aortic valve: There was mild regurgitation. - Mitral valve: There was mild regurgitation.  LHC 11/30/15 1. There is hyperdynamic left ventricular systolic function. 2. Angiographically normal coronary arteries with a right dominant system  02/06/16 TTE - Left ventricle: The cavity size was normal. Wall thickness was   normal. Systolic function was normal. The estimated ejection   fraction was in the range of 60% to 65%. Wall motion was normal;   there were no regional wall motion abnormalities. - Aortic valve: There was mild regurgitation. - Mitral valve: There was mild regurgitation. - Pericardium, extracardiac: A trivial pericardial effusion was   identified.  ASSESSMENT AND PLAN:  1.  PVCs: High burden without an effect on her ejection fraction.  Currently on flecainide and Toprol-XL.Marland Kitchen This rhythm with minimal PVCs.  No changes.    2.  Aortic atherosclerosis: Found incidentally on CT scanning.  Continue to follow.  Current medicines are reviewed at length with the patient today.   The patient does not have concerns regarding her medicines.  The following changes were made today: None  Labs/ tests ordered today include:  Orders Placed This Encounter  Procedures  . EKG  12-Lead     Disposition:   FU with Adri Schloss 12 months  Signed, Worley Radermacher Meredith Leeds, MD  08/04/2019 10:01 AM     [facility] [address] [place] [address] [phone] (office) [phone] (fax)

## 2020-01-04 ENCOUNTER — Other Ambulatory Visit: Payer: Self-pay

## 2020-01-04 MED ORDER — FLECAINIDE ACETATE 100 MG PO TABS: 100.0000 mg | ORAL_TABLET | Freq: Two times a day (BID) | ORAL | 1 refills | Status: DC

## 2020-01-04 MED ORDER — METOPROLOL SUCCINATE ER 25 MG PO TB24: 25.0000 mg | ORAL_TABLET | Freq: Every day | ORAL | 1 refills | Status: DC

## 2020-06-24 ENCOUNTER — Other Ambulatory Visit: Payer: Self-pay | Admitting: Cardiology

## 2020-07-11 ENCOUNTER — Other Ambulatory Visit: Payer: Self-pay | Admitting: Cardiology

## 2020-07-12 NOTE — Telephone Encounter (Signed)
This is a [facility] pt 

## 2020-08-20 ENCOUNTER — Ambulatory Visit (INDEPENDENT_AMBULATORY_CARE_PROVIDER_SITE_OTHER): Payer: Medicare Other | Admitting: Cardiology

## 2020-08-20 ENCOUNTER — Encounter: Payer: Self-pay | Admitting: Cardiology

## 2020-08-20 ENCOUNTER — Other Ambulatory Visit: Payer: Self-pay

## 2020-08-20 VITALS — BP 110/54 | Ht 63.5 in | Wt 121.0 lb

## 2020-08-20 DIAGNOSIS — I493 Ventricular premature depolarization: Secondary | ICD-10-CM

## 2020-08-20 NOTE — Progress Notes (Signed)
Electrophysiology Office Note   Date:  08/20/2020   ID:  Donna Dickson, DOB 1951-03-07, MRN [id number]  PCP:  Houston Siren., MD  Primary Electrophysiologist:  Constance Haw, MD    No chief complaint on file.    History of Present Illness: Donna Dickson is a 69 y.o. female who presents today for electrophysiology evaluation. She presented to hospital with PVCs which were 50% or more of her beats. Cardiac catheterization showed normal coronary arteries and an echo was performed which was also normal. She was put on flecainide with a decreased burden of PVCs. She had an ablation attempt but unfortunately she had multiple morphologies at the time of ablation.  Today, denies symptoms of palpitations, chest pain, shortness of breath, orthopnea, PND, lower extremity edema, claudication, dizziness, presyncope, syncope, bleeding, or neurologic sequela. The patient is tolerating medications without difficulties.  She has been feeling well.  She has no chest pain or shortness of breath.  She Donna Tetrault do all of her daily activities no restriction.  A few months ago, she had a week where she felt on with possibly palpitations some chest discomfort that went to her left arm.  This went away and she had no further episodes.  She has had a left heart catheterization without evidence of coronary artery disease.   Past Medical History:  Diagnosis Date  . Cancer of appendix ([place])   . PVC (premature ventricular contraction) dx'd 11/2015   Past Surgical History:  Procedure Laterality Date  . APPENDECTOMY  1992  . CARDIAC CATHETERIZATION N/A 11/30/2015   Procedure: Left Heart Cath and Coronary Angiography;  Surgeon: Leonie Man, MD;  Location: [facility];  Service: Cardiovascular;  Laterality: N/A;  . COLON SURGERY  1992  . ELECTROPHYSIOLOGIC STUDY N/A 12/20/2015   Procedure: PVC Ablation;  Surgeon: Ulla Mckiernan Meredith Leeds, MD;  Location: [facility];  Service: Cardiovascular;   Laterality: N/A;  . TONSILLECTOMY       Current Outpatient Medications  Medication Sig Dispense Refill  . Cholecalciferol 25 MCG (1000 UT) tablet Take 2 capsules by mouth daily.    . flecainide (TAMBOCOR) 100 MG tablet TAKE 1 TABLET BY MOUTH TWICE A DAY 180 tablet 0  . metoprolol succinate (TOPROL-XL) 25 MG 24 hr tablet TAKE 1 TABLET (25 MG TOTAL) BY MOUTH DAILY. TAKE WITH OR IMMEDIATELY FOLLOWING A MEAL. 90 tablet 0  . thiamine (VITAMIN B-1) 100 MG tablet Take 100 mg by mouth daily.    . vitamin C (ASCORBIC ACID) 500 MG tablet Take 500 mg by mouth daily.    . vitamin E 100 UNIT capsule Take 100 Units by mouth daily.     No current facility-administered medications for this visit.    Allergies:   Patient has no known allergies.   Social History:  The patient  reports that she has never smoked. She has never used smokeless tobacco. She reports current alcohol use. She reports that she does not use drugs.   Family History:  The patient's family history includes Arrhythmia in her brother; Cardiomyopathy in her brother; Colon cancer in her father; Heart attack in her brother and paternal grandfather; Heart disease in her brother and brother.   ROS:  Please see the history of present illness.   Otherwise, review of systems is positive for none.   All other systems are reviewed and negative.   PHYSICAL EXAM: VS:  BP (!) 110/54   Ht 5' 3.5" (1.613 m)   Wt 121  lb (54.9 kg)   SpO2 98%   BMI 21.10 kg/m  , BMI Body mass index is 21.1 kg/m. GEN: Well nourished, well developed, in no acute distress  HEENT: normal  Neck: no JVD, carotid bruits, or masses Cardiac: RRR; no murmurs, rubs, or gallops,no edema  Respiratory:  clear to auscultation bilaterally, normal work of breathing GI: soft, nontender, nondistended, + BS MS: no deformity or atrophy  Skin: warm and dry Neuro:  Strength and sensation are intact Psych: euthymic mood, full affect  EKG:  EKG is ordered today. Personal review  of the ekg ordered shows sinus rhythm, rate 62  Recent Labs: No results found for requested labs within last 8760 hours.    Lipid Panel     Component Value Date/Time   CHOL 136 11/29/2015 0249   TRIG 49 11/29/2015 0249   HDL 73 11/29/2015 0249   CHOLHDL 1.9 11/29/2015 0249   VLDL 10 11/29/2015 0249   LDLCALC 53 11/29/2015 0249     Wt Readings from Last 3 Encounters:  08/20/20 121 lb (54.9 kg)  08/04/19 117 lb 3.2 oz (53.2 kg)  02/03/19 116 lb 3.2 oz (52.7 kg)      Other studies Reviewed: Additional studies/ records that were reviewed today include: TTE 11/30/15 Review of the above records today demonstrates:  Left ventricle: The cavity size was normal. Wall thickness was  normal. Systolic function was normal. The estimated ejection  fraction was in the range of 60% to 65%. Wall motion was normal;  there were no regional wall motion abnormalities. - Aortic valve: There was mild regurgitation. - Mitral valve: There was mild regurgitation.  LHC 11/30/15 1. There is hyperdynamic left ventricular systolic function. 2. Angiographically normal coronary arteries with a right dominant system  02/06/16 TTE - Left ventricle: The cavity size was normal. Wall thickness was   normal. Systolic function was normal. The estimated ejection   fraction was in the range of 60% to 65%. Wall motion was normal;   there were no regional wall motion abnormalities. - Aortic valve: There was mild regurgitation. - Mitral valve: There was mild regurgitation. - Pericardium, extracardiac: A trivial pericardial effusion was   identified.  ASSESSMENT AND PLAN:  1. PVCs: High burden without effect on her ejection fraction. Currently on flecainide (ECG monitoring for high risk medication) and Toprol-XL. Minimal PVCs since starting these medications. No changes.    2. Aortic atherosclerosis: Found incidentally on CT scan. Continue to follow.  Current medicines are reviewed at length with the patient  today.   The patient does not have concerns regarding her medicines.  The following changes were made today: None  Labs/ tests ordered today include:  Orders Placed This Encounter  Procedures  . EKG 12-Lead     Disposition:   FU with [name] 12 months  Signed, Alysa Duca Meredith Leeds, MD  08/20/2020 2:57 PM     [place] [address] [phone] (office) [phone] (fax)

## 2020-09-15 ENCOUNTER — Other Ambulatory Visit: Payer: Self-pay | Admitting: Cardiology

## 2020-10-08 ENCOUNTER — Other Ambulatory Visit: Payer: Self-pay | Admitting: Cardiology

## 2020-10-09 NOTE — Telephone Encounter (Signed)
This is a [facility] pt 

## 2021-01-01 ENCOUNTER — Other Ambulatory Visit: Payer: Self-pay | Admitting: Cardiology

## 2021-06-26 ENCOUNTER — Other Ambulatory Visit: Payer: Self-pay | Admitting: Cardiology

## 2021-08-26 ENCOUNTER — Encounter: Payer: Self-pay | Admitting: Cardiology

## 2021-08-26 ENCOUNTER — Ambulatory Visit (INDEPENDENT_AMBULATORY_CARE_PROVIDER_SITE_OTHER): Payer: Medicare Other | Admitting: Cardiology

## 2021-08-26 ENCOUNTER — Other Ambulatory Visit: Payer: Self-pay

## 2021-08-26 VITALS — BP 108/60 | HR 69 | Ht 63.0 in | Wt 126.0 lb

## 2021-08-26 DIAGNOSIS — I493 Ventricular premature depolarization: Secondary | ICD-10-CM

## 2021-08-26 NOTE — Progress Notes (Signed)
Electrophysiology Office Note   Date:  08/26/2021   ID:  Donna Dickson, DOB [birth year], MRN [id number]  PCP:  Houston Siren., MD  Primary Electrophysiologist:  Constance Haw, MD    No chief complaint on file.    History of Present Illness: Donna Dickson is a 70 y.o. female who presents today for electrophysiology evaluation.   She presented hospital with PVCs at a burden of greater than 50%.  Catheterization showed normal coronary arteries.  She had an echo that was also normal.  She was put on flecainide with a decreased overt burden.  She had an attempted PVC ablation, though she had multiple morphologies at the time of ablation.    Today, denies symptoms of palpitations, chest pain, shortness of breath, orthopnea, PND, lower extremity edema, claudication, dizziness, presyncope, syncope, bleeding, or neurologic sequela. The patient is tolerating medications without difficulties.  Since being seen she has done well.  She has had no chest pain or shortness of breath.  Is able do all of her daily activities without restriction.  She is overall happy with her control.  She has had some mild palpitations, but they are short-lived and rare.   Past Medical History:  Diagnosis Date   Cancer of appendix ([place])    PVC (premature ventricular contraction) dx'd 11/2015   Past Surgical History:  Procedure Laterality Date   APPENDECTOMY  1992   CARDIAC CATHETERIZATION N/A 11/30/2015   Procedure: Left Heart Cath and Coronary Angiography;  Surgeon: Leonie Man, MD;  Location: [facility];  Service: Cardiovascular;  Laterality: N/A;   COLON SURGERY  1992   ELECTROPHYSIOLOGIC STUDY N/A 12/20/2015   Procedure: PVC Ablation;  Surgeon: Doreatha Offer Meredith Leeds, MD;  Location: [facility];  Service: Cardiovascular;  Laterality: N/A;   TONSILLECTOMY       Current Outpatient Medications  Medication Sig Dispense Refill   flecainide (TAMBOCOR) 100 MG tablet TAKE 1 TABLET BY MOUTH  TWICE A DAY 180 tablet 0   metoprolol succinate (TOPROL-XL) 25 MG 24 hr tablet TAKE 1 TABLET (25 MG TOTAL) BY MOUTH DAILY. TAKE WITH OR IMMEDIATELY FOLLOWING A MEAL. 90 tablet 3   vitamin C (ASCORBIC ACID) 500 MG tablet Take 500 mg by mouth daily.     VITAMIN D, CHOLECALCIFEROL, PO Take by mouth.     Zinc 50 MG CAPS Take by mouth.     Cholecalciferol 25 MCG (1000 UT) tablet Take 2 capsules by mouth daily. (Patient not taking: Reported on 08/26/2021)     thiamine (VITAMIN B-1) 100 MG tablet Take 100 mg by mouth daily. (Patient not taking: Reported on 08/26/2021)     vitamin E 100 UNIT capsule Take 100 Units by mouth daily. (Patient not taking: Reported on 08/26/2021)     No current facility-administered medications for this visit.    Allergies:   Patient has no known allergies.   Social History:  The patient  reports that she has never smoked. She has never used smokeless tobacco. She reports current alcohol use. She reports that she does not use drugs.   Family History:  The patient's family history includes Arrhythmia in her brother; Cardiomyopathy in her brother; Colon cancer in her father; Heart attack in her brother and paternal grandfather; Heart disease in her brother and brother.   ROS:  Please see the history of present illness.   Otherwise, review of systems is positive for none.   All other systems are reviewed and negative.  PHYSICAL EXAM: VS:  BP 108/60   Pulse 69   Ht 5\' 3"  (1.6 m)   Wt 126 lb (57.2 kg)   SpO2 95%   BMI 22.32 kg/m  , BMI Body mass index is 22.32 kg/m. GEN: Well nourished, well developed, in no acute distress  HEENT: normal  Neck: no JVD, carotid bruits, or masses Cardiac: RRR; no murmurs, rubs, or gallops,no edema  Respiratory:  clear to auscultation bilaterally, normal work of breathing GI: soft, nontender, nondistended, + BS MS: no deformity or atrophy  Skin: warm and dry Neuro:  Strength and sensation are intact Psych: euthymic mood, full  affect  EKG:  EKG is ordered today. Personal review of the ekg ordered shows sinus rhythm, biatrial enlargement, rate 69  Recent Labs: No results found for requested labs within last 8760 hours.    Lipid Panel     Component Value Date/Time   CHOL 136 11/29/2015 0249   TRIG 49 11/29/2015 0249   HDL 73 11/29/2015 0249   CHOLHDL 1.9 11/29/2015 0249   VLDL 10 11/29/2015 0249   LDLCALC 53 11/29/2015 0249     Wt Readings from Last 3 Encounters:  08/26/21 126 lb (57.2 kg)  08/20/20 121 lb (54.9 kg)  08/04/19 117 lb 3.2 oz (53.2 kg)      Other studies Reviewed: Additional studies/ records that were reviewed today include: TTE 11/30/15 Review of the above records today demonstrates:  Left ventricle: The cavity size was normal. Wall thickness was   normal. Systolic function was normal. The estimated ejection   fraction was in the range of 60% to 65%. Wall motion was normal;   there were no regional wall motion abnormalities. - Aortic valve: There was mild regurgitation. - Mitral valve: There was mild regurgitation.  LHC 11/30/15 There is hyperdynamic left ventricular systolic function. Angiographically normal coronary arteries with a right dominant system  02/06/16 TTE - Left ventricle: The cavity size was normal. Wall thickness was   normal. Systolic function was normal. The estimated ejection   fraction was in the range of 60% to 65%. Wall motion was normal;   there were no regional wall motion abnormalities. - Aortic valve: There was mild regurgitation. - Mitral valve: There was mild regurgitation. - Pericardium, extracardiac: A trivial pericardial effusion was   identified.  ASSESSMENT AND PLAN:  1.  PVCs: High burden noted without effect on her ejection fraction.  Currently on flecainide 100 mg twice daily, Toprol-XL 25 mg daily.  High risk medication monitoring for flecainide via ECG.  Some mild palpitations but no major issues.  Continue with current management.  2.   Aortic atherosclerosis: Found on incidentally on CT scan.  Continue to follow.  Current medicines are reviewed at length with the patient today.   The patient does not have concerns regarding her medicines.  The following changes were made today: None  Labs/ tests ordered today include:  Orders Placed This Encounter  Procedures   EKG 12-Lead      Disposition:   FU with Seferina Brokaw 12 months  Signed, Cyrah Mclamb Meredith Leeds, MD  08/26/2021 2:13 PM     [facility] [address] [place] [address] [phone] (office) [phone] (fax)

## 2021-08-29 ENCOUNTER — Other Ambulatory Visit: Payer: Self-pay | Admitting: Cardiology

## 2021-09-18 ENCOUNTER — Other Ambulatory Visit: Payer: Self-pay | Admitting: Cardiology

## 2022-08-17 ENCOUNTER — Other Ambulatory Visit: Payer: Self-pay | Admitting: Cardiology

## 2022-09-08 ENCOUNTER — Other Ambulatory Visit: Payer: Self-pay | Admitting: Cardiology

## 2022-09-15 ENCOUNTER — Ambulatory Visit: Payer: Medicare Other | Attending: Cardiology | Admitting: Cardiology

## 2022-09-15 ENCOUNTER — Encounter: Payer: Self-pay | Admitting: Cardiology

## 2022-09-15 VITALS — BP 106/56 | HR 62 | Ht 63.0 in | Wt 123.0 lb

## 2022-09-15 DIAGNOSIS — I493 Ventricular premature depolarization: Secondary | ICD-10-CM | POA: Diagnosis present

## 2022-09-15 NOTE — Progress Notes (Signed)
Electrophysiology Office Note   Date:  09/15/2022   ID:  Chaniece Barbato, DOB 1951-07-30, MRN [id number]  PCP:  Houston Siren., MD  Primary Electrophysiologist:  Constance Haw, MD    No chief complaint on file.     History of Present Illness: Donna Dickson is a 71 y.o. female who presents today for electrophysiology evaluation.   She presented to the hospital with PVCs with a burden of greater than 50%.  Catheterization showed normal coronary arteries.  She had an echo which was also normal.  She was put on flecainide with an overall decreased burden.  She had attempted PVC ablation though she had multiple morphologies at the time of ablation.  Today, denies symptoms of palpitations, chest pain, shortness of breath, orthopnea, PND, lower extremity edema, claudication, dizziness, presyncope, syncope, bleeding, or neurologic sequela. The patient is tolerating medications without difficulties.     Past Medical History:  Diagnosis Date   Cancer of appendix ([employer])    PVC (premature ventricular contraction) dx'd 11/2015   Past Surgical History:  Procedure Laterality Date   APPENDECTOMY  1992   CARDIAC CATHETERIZATION N/A 11/30/2015   Procedure: Left Heart Cath and Coronary Angiography;  Surgeon: Leonie Man, MD;  Location: [facility];  Service: Cardiovascular;  Laterality: N/A;   COLON SURGERY  1992   ELECTROPHYSIOLOGIC STUDY N/A 12/20/2015   Procedure: PVC Ablation;  Surgeon: Chandra Feger Meredith Leeds, MD;  Location: [facility];  Service: Cardiovascular;  Laterality: N/A;   TONSILLECTOMY       Current Outpatient Medications  Medication Sig Dispense Refill   Cholecalciferol 25 MCG (1000 UT) tablet Take 2 capsules by mouth daily.     flecainide (TAMBOCOR) 100 MG tablet TAKE 1 TABLET BY MOUTH TWICE A DAY 60 tablet 0   metoprolol succinate (TOPROL-XL) 25 MG 24 hr tablet TAKE 1 TABLET BY MOUTH DAILY. TAKE WITH OR IMMEDIATELY FOLLOWING A MEAL. 90 tablet 0   thiamine  (VITAMIN B-1) 100 MG tablet Take 100 mg by mouth daily.     vitamin C (ASCORBIC ACID) 500 MG tablet Take 500 mg by mouth daily.     vitamin E 100 UNIT capsule Take 100 Units by mouth daily.     Zinc 50 MG CAPS Take 1 capsule by mouth daily.     No current facility-administered medications for this visit.    Allergies:   Patient has no known allergies.   Social History:  The patient  reports that she has never smoked. She has never used smokeless tobacco. She reports current alcohol use. She reports that she does not use drugs.   Family History:  The patient's family history includes Arrhythmia in her brother; Cardiomyopathy in her brother; Colon cancer in her father; Heart attack in her brother and paternal grandfather; Heart disease in her brother and brother.   ROS:  Please see the history of present illness.   Otherwise, review of systems is positive for none.   All other systems are reviewed and negative.   PHYSICAL EXAM: VS:  BP (!) 106/56   Pulse 62   Ht '5\' 3"'$  (1.6 m)   Wt 123 lb (55.8 kg)   SpO2 96%   BMI 21.79 kg/m  , BMI Body mass index is 21.79 kg/m. GEN: Well nourished, well developed, in no acute distress  HEENT: normal  Neck: no JVD, carotid bruits, or masses Cardiac: RRR; no murmurs, rubs, or gallops,no edema  Respiratory:  clear to auscultation bilaterally,  normal work of breathing GI: soft, nontender, nondistended, + BS MS: no deformity or atrophy  Skin: warm and dry Neuro:  Strength and sensation are intact Psych: euthymic mood, full affect  EKG:  EKG is ordered today. Personal review of the ekg ordered shows sinus rhythm, right axis, rate 62  Recent Labs: No results found for requested labs within last 365 days.    Lipid Panel     Component Value Date/Time   CHOL 136 11/29/2015 0249   TRIG 49 11/29/2015 0249   HDL 73 11/29/2015 0249   CHOLHDL 1.9 11/29/2015 0249   VLDL 10 11/29/2015 0249   LDLCALC 53 11/29/2015 0249     Wt Readings from Last 3  Encounters:  09/15/22 123 lb (55.8 kg)  08/26/21 126 lb (57.2 kg)  08/20/20 121 lb (54.9 kg)      Other studies Reviewed: Additional studies/ records that were reviewed today include: TTE 11/30/15 Review of the above records today demonstrates:  Left ventricle: The cavity size was normal. Wall thickness was   normal. Systolic function was normal. The estimated ejection   fraction was in the range of 60% to 65%. Wall motion was normal;   there were no regional wall motion abnormalities. - Aortic valve: There was mild regurgitation. - Mitral valve: There was mild regurgitation.  LHC 11/30/15 There is hyperdynamic left ventricular systolic function. Angiographically normal coronary arteries with a right dominant system  02/06/16 TTE - Left ventricle: The cavity size was normal. Wall thickness was   normal. Systolic function was normal. The estimated ejection   fraction was in the range of 60% to 65%. Wall motion was normal;   there were no regional wall motion abnormalities. - Aortic valve: There was mild regurgitation. - Mitral valve: There was mild regurgitation. - Pericardium, extracardiac: A trivial pericardial effusion was   identified.  ASSESSMENT AND PLAN:  1.  PVCs: High burden noted on telemetry in the hospital without an effect on her ejection fraction.  Currently on flecainide 100 mg twice daily, Toprol-XL 25 mg daily.  Average medication monitoring for for flecainide via ECG today.  QRS remains narrow. Feeling well.  Continue current management.  2.  Aortic atherosclerosis: Found incidentally on CT scan.  Continue to monitor.   Current medicines are reviewed at length with the patient today.   The patient does not have concerns regarding her medicines.  The following changes were made today: none  Labs/ tests ordered today include:  Orders Placed This Encounter  Procedures   EKG 12-Lead      Disposition:   FU with Daeshon Grammatico 12 months  Signed, Kaidence Sant Meredith Leeds, MD  09/15/2022 2:38 PM     [facility] [address] [place] [address] [phone] (office) [phone] (fax)

## 2022-09-15 NOTE — Patient Instructions (Signed)
Medication Instructions:  Your physician recommends that you continue on your current medications as directed. Please refer to the Current Medication list given to you today.  *If you need a refill on your cardiac medications before your next appointment, please call your pharmacy*   Lab Work: None ordered   Testing/Procedures: None ordered   Follow-Up: At [facility], you and your health needs are our priority.  As part of our continuing mission to provide you with exceptional heart care, we have created designated Provider Care Teams.  These Care Teams include your primary Cardiologist (physician) and Advanced Practice Providers (APPs -  Physician Assistants and Nurse Practitioners) who all work together to provide you with the care you need, when you need it.  Your next appointment:   1 year(s)  The format for your next appointment:   In Person  Provider:   Will Camnitz, MD    Thank you for choosing [facility]!!   Latayvia Mandujano, RN [phone]  Other Instructions   Important Information About Sugar           

## 2022-10-04 ENCOUNTER — Other Ambulatory Visit: Payer: Self-pay | Admitting: Cardiology

## 2022-11-15 ENCOUNTER — Other Ambulatory Visit: Payer: Self-pay | Admitting: Cardiology

## 2023-07-15 ENCOUNTER — Other Ambulatory Visit: Payer: Self-pay | Admitting: Cardiology

## 2023-11-06 ENCOUNTER — Other Ambulatory Visit: Payer: Self-pay | Admitting: Cardiology

## 2023-12-07 ENCOUNTER — Encounter: Payer: Self-pay | Admitting: Cardiology

## 2023-12-07 ENCOUNTER — Ambulatory Visit: Payer: Medicare (Managed Care) | Attending: Cardiology | Admitting: Cardiology

## 2023-12-07 VITALS — BP 102/50 | HR 61 | Ht 63.0 in | Wt 126.0 lb

## 2023-12-07 DIAGNOSIS — I493 Ventricular premature depolarization: Secondary | ICD-10-CM | POA: Diagnosis not present

## 2023-12-07 NOTE — Progress Notes (Signed)
  Electrophysiology Office Note:   Date:  12/07/2023  ID:  Donna Dickson, DOB Aug 22, 1951, MRN [id number]  Primary Cardiologist: None Primary Heart Failure: None Electrophysiologist: Jonty Morrical Cortland Ding, MD      History of Present Illness:   Donna Dickson is a 73 y.o. female with h/o PVCs seen today for routine electrophysiology followup.   Since last being seen in our clinic the patient reports doing overall well.  She has no chest pain or shortness of breath.  She is able to do all of her daily activities without restriction.  She is quite happy with her control.  She does have intermittent headaches and notes very short episodes of palpitations, but does not feel that therapy needs to be adjusted to be required.  she denies chest pain, palpitations, dyspnea, PND, orthopnea, nausea, vomiting, dizziness, syncope, edema, weight gain, or early satiety.   Review of systems complete and found to be negative unless listed in HPI.   EP Information / Studies Reviewed:    EKG is ordered today. Personal review as below.  EKG Interpretation Date/Time:  Monday December 07 2023 14:45:25 EST Ventricular Rate:  61 PR Interval:  178 QRS Duration:  88 QT Interval:  420 QTC Calculation: 422 R Axis:   93  Text Interpretation: Normal sinus rhythm Rightward axis Nonspecific ST and T wave abnormality When compared with ECG of 20-Dec-2015 13:17, Sinus rhythm Confirmed by Dorthey Depace ([id number]) on 12/07/2023 2:53:32 PM     Risk Assessment/Calculations:              Physical Exam:   VS:  BP (!) 102/50   Pulse 61   Ht 5\' 3"  (1.6 m)   Wt 126 lb (57.2 kg)   SpO2 98%   BMI 22.32 kg/m    Wt Readings from Last 3 Encounters:  12/07/23 126 lb (57.2 kg)  09/15/22 123 lb (55.8 kg)  08/26/21 126 lb (57.2 kg)     GEN: Well nourished, well developed in no acute distress NECK: No JVD; No carotid bruits CARDIAC: Regular rate and rhythm, no murmurs, rubs, gallops RESPIRATORY:  Clear to auscultation without  rales, wheezing or rhonchi  ABDOMEN: Soft, non-tender, non-distended EXTREMITIES:  No edema; No deformity   ASSESSMENT AND PLAN:    1.  PVCs: Elevated burden on telemetry while in hospital, appears to be 50%.  Currently on flecainide  100 mg twice daily, Toprol -XL 25 mg daily.  Minimal symptoms on her current medications.  She is happy with her control.  Donna Dickson continue with current management.  2.  Aortic sclerosis: Found incidentally on the CT scan.  Continue monitoring.  Follow up with Dr. Lawana Pray in 12 months  Signed, Deepa Barthel Cortland Ding, MD

## 2023-12-26 ENCOUNTER — Other Ambulatory Visit: Payer: Self-pay | Admitting: Cardiology

## 2024-02-07 ENCOUNTER — Other Ambulatory Visit: Payer: Self-pay | Admitting: Cardiology
